# Patient Record
Sex: Male | Born: 1975 | Race: White | Hispanic: No | Marital: Single | Smoking: Current every day smoker
Health system: Southern US, Community
[De-identification: ages and names within clinical notes are randomized; demographics above are authoritative.]

## PROBLEM LIST (undated history)

## (undated) DIAGNOSIS — F1010 Alcohol abuse, uncomplicated: Secondary | ICD-10-CM

## (undated) DIAGNOSIS — Z720 Tobacco use: Secondary | ICD-10-CM

---

## 2000-07-06 ENCOUNTER — Emergency Department (HOSPITAL_COMMUNITY): Admission: EM | Admit: 2000-07-06 | Discharge: 2000-07-06 | Payer: Self-pay | Admitting: Emergency Medicine

## 2000-08-19 ENCOUNTER — Encounter: Payer: Self-pay | Admitting: Emergency Medicine

## 2000-08-19 ENCOUNTER — Emergency Department (HOSPITAL_COMMUNITY): Admission: EM | Admit: 2000-08-19 | Discharge: 2000-08-19 | Payer: Self-pay | Admitting: Emergency Medicine

## 2000-10-24 ENCOUNTER — Encounter: Payer: Self-pay | Admitting: Emergency Medicine

## 2000-10-24 ENCOUNTER — Emergency Department (HOSPITAL_COMMUNITY): Admission: EM | Admit: 2000-10-24 | Discharge: 2000-10-24 | Payer: Self-pay | Admitting: Emergency Medicine

## 2001-04-03 ENCOUNTER — Emergency Department (HOSPITAL_COMMUNITY): Admission: EM | Admit: 2001-04-03 | Discharge: 2001-04-03 | Payer: Self-pay | Admitting: Emergency Medicine

## 2001-04-04 ENCOUNTER — Encounter: Payer: Self-pay | Admitting: Emergency Medicine

## 2002-04-22 ENCOUNTER — Encounter: Payer: Self-pay | Admitting: Emergency Medicine

## 2002-04-22 ENCOUNTER — Emergency Department (HOSPITAL_COMMUNITY): Admission: EM | Admit: 2002-04-22 | Discharge: 2002-04-22 | Payer: Self-pay | Admitting: Emergency Medicine

## 2002-10-09 ENCOUNTER — Encounter: Payer: Self-pay | Admitting: Orthopedic Surgery

## 2002-10-09 ENCOUNTER — Observation Stay (HOSPITAL_COMMUNITY): Admission: EM | Admit: 2002-10-09 | Discharge: 2002-10-09 | Payer: Self-pay | Admitting: Emergency Medicine

## 2002-10-09 ENCOUNTER — Encounter: Payer: Self-pay | Admitting: *Deleted

## 2003-02-06 ENCOUNTER — Ambulatory Visit (HOSPITAL_BASED_OUTPATIENT_CLINIC_OR_DEPARTMENT_OTHER): Admission: RE | Admit: 2003-02-06 | Discharge: 2003-02-06 | Payer: Self-pay | Admitting: Orthopedic Surgery

## 2004-04-07 ENCOUNTER — Emergency Department (HOSPITAL_COMMUNITY): Admission: EM | Admit: 2004-04-07 | Discharge: 2004-04-07 | Payer: Self-pay | Admitting: Emergency Medicine

## 2004-05-23 ENCOUNTER — Emergency Department (HOSPITAL_COMMUNITY): Admission: EM | Admit: 2004-05-23 | Discharge: 2004-05-23 | Payer: Self-pay | Admitting: *Deleted

## 2004-05-25 ENCOUNTER — Emergency Department (HOSPITAL_COMMUNITY): Admission: EM | Admit: 2004-05-25 | Discharge: 2004-05-25 | Payer: Self-pay | Admitting: Emergency Medicine

## 2005-03-18 ENCOUNTER — Emergency Department (HOSPITAL_COMMUNITY): Admission: AC | Admit: 2005-03-18 | Discharge: 2005-03-19 | Payer: Self-pay

## 2005-06-06 ENCOUNTER — Emergency Department (HOSPITAL_COMMUNITY): Admission: EM | Admit: 2005-06-06 | Discharge: 2005-06-06 | Payer: Self-pay | Admitting: Emergency Medicine

## 2005-07-16 ENCOUNTER — Emergency Department (HOSPITAL_COMMUNITY): Admission: EM | Admit: 2005-07-16 | Discharge: 2005-07-16 | Payer: Self-pay | Admitting: Emergency Medicine

## 2006-04-24 ENCOUNTER — Emergency Department (HOSPITAL_COMMUNITY): Admission: EM | Admit: 2006-04-24 | Discharge: 2006-04-25 | Payer: Self-pay | Admitting: Emergency Medicine

## 2008-06-02 ENCOUNTER — Emergency Department (HOSPITAL_COMMUNITY): Admission: EM | Admit: 2008-06-02 | Discharge: 2008-06-02 | Payer: Self-pay | Admitting: Emergency Medicine

## 2009-04-11 ENCOUNTER — Emergency Department (HOSPITAL_COMMUNITY): Admission: EM | Admit: 2009-04-11 | Discharge: 2009-04-11 | Payer: Self-pay | Admitting: Emergency Medicine

## 2011-05-05 NOTE — Op Note (Signed)
   NAME:  Marcus Zamora, Marcus Zamora                            ACCOUNT NO.:  [id number]   MEDICAL RECORD NO.:  [id number]                   PATIENT TYPE:  AMB   LOCATION:  DSC                                  FACILITY:  [facility]   PHYSICIAN:  Thera Flake., M.D.             DATE OF BIRTH:  1975-12-25   DATE OF PROCEDURE:  DATE OF DISCHARGE:                                 OPERATIVE REPORT   PREOPERATIVE DIAGNOSIS:  Retained Syndesmotic screws of left ankle.   POSTOPERATIVE DIAGNOSIS:  Retained Syndesmotic screws of left ankle.   OPERATION:  Removal Syndesmotic screws (x 2) left ankle.   SURGEON:  Dyke Brackett, M.D.   ANESTHESIA:  General   INDICATIONS FOR PROCEDURE:  This is a 35 year old status post ORIF of ankle  with retained Syndesmotic screws for removal as an outpatient.   DESCRIPTION OF PROCEDURE:  Sterile prepped and draped.  Exsanguination of  leg.  Placement of tourniquet 350.  About a 2 inch incision was opened over  the two Syndesmotic screws which were identified under a moderate amount of  scar tissue.  They were removed without difficulty.  Their location was  confirmed with an OEC C-arm.  Copious irrigation carried out.  Closure with  2-0 Vicryl and skin clips.  Marcaine with epinephrine in the wound.  Light  compressive sterile dressing applied.  The patient taken to the recovery  room in stable condition.                                               Thera Flake., M.D.    WDC/MEDQ  D:  02/06/2003  T:  02/06/2003  Job:  [id number]

## 2011-05-05 NOTE — Consult Note (Signed)
[facility]  Patient:    Marcus Zamora                           MRN: [id number] Adm. Date:  [id number] Attending:  Cathren Laine CC:         Trudi Ida. Denton Lank, M.D.   Consultation Report  HISTORY OF PRESENT ILLNESS:  I had the pleasure to see Marcus Zamora in the emergency room at [facility] today.  He is a 35 year old right-hand dominant white male who sustained an injury to his right dominant hand 12 hours ago.  The patient placed his hand through a piece of plate glass.  This occurred around 8 p.m. last night.  He did not have a ride to the emergency room and thus presents today 12-14 hours after the incident for evaluation. He denies numbness and tingling but complains of excruciating pain.  The patient is unable to move the fingers fully due to the pain.  He denies other injury in the opposite extremity or bilateral lower extremities.  His tetanus shot is questionable, and thus I am giving him one today so it will be up-to-date.  ALLERGIES:  None.  MEDICATIONS:  None.  PAST SURGICAL HISTORY:  None.  PAST MEDICAL HISTORY:  None.  SOCIAL HISTORY:  He smokes two packs per day, occasionally drinks.  He is a Designer, fashion/clothing at [employer].  PHYSICAL EXAMINATION:  GENERAL:  This was a white male, alert and oriented, in no acute distress.  HEENT:  Normal.  EXTREMITIES:  The left upper extremity is atraumatic.  Right upper extremity has lacerations, one 3 cm over the dorsal wrist region at the radiocarpal joint.  There is another one just distal to this.  The patient has lacerations with a foreign body noted on x-ray about the mid-metacarpal region of the index finger and a minor abrasion about the index finger PIP region radially. The middle and ring finger have dorsal lacerations over the P1 regions.  The small finger does not have any lacerations over the top of it, but there are two dorsal lacerations over the hand in line with the ring  and small finger metacarpal.  There are no signs or symptoms of compartment syndrome. Extension is difficult to fully examine due to pain.  The patient has normal median and ulnar nerve sensation as well as radial nerve sensation.  The patient does not exhibit any proximal forearm pain.  DIAGNOSTIC EVALUATION:  On x-ray, the patient has no obvious fractures, dislocations, space-occupying lesion in the bone; however, there is a foreign body (glass) in the soft tissue about the index metacarpal.  IMPRESSION:  Status post glass laceration to multiple lesions about the hand.  PLAN:  I verbally consented the patient for I&D and repair of structures as needed.  DESCRIPTION OF PROCEDURE:  The patient was given a superficial radial nerve block as well as digital blocks as necessary and dorsal cutaneous nerve blocks by myself with 1% lidocaine without epinephrine.  He tolerated this well. Following this, I soaked him in peroxide and then prepped and draped him with Betadine scrub followed by Betadine paint, and once the sterile field was secured, the patient had I&D of the dorsal wrist laceration.  This was a 3 cm laceration.  There was another laceration just distal to this that was 1 cm. I&D of the skin, subcutaneous tissue, and extensor tendon region was accomplished.  I explored the [facility] and  EIP in this region, and they were intact.  There was a small scrape to them, but the majority was intact and did not need repair.  I thus I&Dd it, placed one loose chromic suture in this region, and allowed the wound to remain open to allow for egress of fluid. Following this, attention was turned toward the mid-metacarpal region of the index finger, where I performed I&D of the skin and subcutaneous tissue and tendinous tissue.  There was a small 5% laceration to the EIP tendon.  The EDC tendon was intact, interestingly.  The patient had three pieces of glass removed from the area.  I copiously  irrigated the wound and periosteum over the index metacarpal.  The index metacarpal was not fractured.  I also I&Dd an abrasion about the PIP region radially of the middle finger.  The patient underwent I&D of a 1 cm wound about the dorsal ulnar hand region and a 1 cm wound over the dorsal aspect of the middle finger.  This includes the skin and subcutaneous tissue.  I explored the extensor tendon about the dorsal hand wound dorso-ulnarly in line with the ring finger, and this was intact. Following this, I I&Dd skin and subcutaneous tissue and extensor tendon region of the ring finger to the right hand and performed a repair of a 50% laceration to the EDC overlying the P1 region.  This was done with Mersilene, and I loosely closed this area.  The other areas were left open, as this was a late presentation of the injury.  The patient tolerated this well without difficulty.  All areas were copiously I&Dd and tended to.  I do think he can be placed on an early range of motion program for the extensor tendon laceration, given its size.  Once this was done, areas were dressed with Xeroform gauze and Kerlix was placed around the hand, followed by Kling wrap. The patient had a sling applied.  He was given a tetanus shot and 1 g of Ancef IM.  He will return to see me in one weeks time and continue elevation, take Keflex as well as Percocet for pain.  All questions have been encouraged and answered. DD:  08/19/00 TD:  08/20/00 Job: [id number]

## 2011-05-05 NOTE — Op Note (Signed)
   NAME:  Marcus Zamora, Marcus Zamora NO.:  [id number]   MEDICAL RECORD NO.:  [id number]                   PATIENT TYPE:  OBV   LOCATION:  0480                                 FACILITY:  [facility]   PHYSICIAN:  Thera Flake., MD               DATE OF BIRTH:  Oct 14, 1976   DATE OF PROCEDURE:  10/09/2002  DATE OF DISCHARGE:  10/09/2002                                 OPERATIVE REPORT   PREOPERATIVE DIAGNOSIS:  Displaced lateral malleolus fracture with  disruption of syndesmosis and medial deltoid.   POSTOPERATIVE DIAGNOSIS:  Displaced lateral malleolus fracture with  disruption of syndesmosis and medial deltoid.   OPERATION:  1. Open reduction and internal fixation of fibula.  2. Open reduction and internal fixation of syndesmosis (a small fragment set     8-hole semi-tubular plate with two 4-0 syndesmotic screws).   SURGEON:  Dyke Brackett, MD   ASSISTANT:  Jamelle Rushing, P.A.   TOURNIQUET TIME:  Approximately one  hour.   DESCRIPTION OF PROCEDURE:  After sterile prep and drape the leg was  exsanguinated and the tourniquet was inflated to 350. A longitudinal  incision was made over the fibula. Identification of a short oblique fibula  fracture was revisionally reduced. Fixed with an 8-hole plate. One of the  screw holes could not be used due to the fact that it was over the fracture  site. Two holes were placed into the distal piece which was somewhat small  in terms of the smaller distal fragment with approximately 3 to 4 holes  proximally. Two of the intervening holes above the syndesmosis were used to  fix the syndesmosis back with 4-0 cancellous screws, reducing the  syndesmosis slightly anatomically in an anatomic reduction of the fibula  confirmed by the x-ray.   The wound was irrigated and closed with 0 Vicryl, 2-0 Vicryl and skin clips.  Marcaine without epinephrine was infiltrated into the wound. A lightly  compressive sterile dressing was  applied.                                               Thera Flake., MD    WDC/MEDQ  D:  10/09/2002  T:  10/10/2002  Job:  [id number]

## 2011-06-25 ENCOUNTER — Emergency Department (HOSPITAL_COMMUNITY): Payer: Self-pay

## 2011-06-25 ENCOUNTER — Emergency Department (HOSPITAL_COMMUNITY)
Admission: EM | Admit: 2011-06-25 | Discharge: 2011-06-25 | Disposition: A | Discharge status: Home / Self Care | Payer: Self-pay | Attending: Emergency Medicine | Admitting: Emergency Medicine

## 2011-06-25 DIAGNOSIS — R071 Chest pain on breathing: Secondary | ICD-10-CM | POA: Insufficient documentation

## 2013-05-22 ENCOUNTER — Encounter (HOSPITAL_COMMUNITY): Payer: Self-pay | Admitting: *Deleted

## 2013-05-22 ENCOUNTER — Emergency Department (HOSPITAL_COMMUNITY)
Admission: EM | Admit: 2013-05-22 | Discharge: 2013-05-23 | Disposition: A | Discharge status: Home / Self Care | Payer: Self-pay | Attending: Emergency Medicine | Admitting: Emergency Medicine

## 2013-05-22 ENCOUNTER — Emergency Department (HOSPITAL_COMMUNITY): Payer: Self-pay

## 2013-05-22 DIAGNOSIS — F17200 Nicotine dependence, unspecified, uncomplicated: Secondary | ICD-10-CM | POA: Insufficient documentation

## 2013-05-22 DIAGNOSIS — T7411XA Adult physical abuse, confirmed, initial encounter: Secondary | ICD-10-CM | POA: Insufficient documentation

## 2013-05-22 DIAGNOSIS — S2239XA Fracture of one rib, unspecified side, initial encounter for closed fracture: Secondary | ICD-10-CM | POA: Insufficient documentation

## 2013-05-22 DIAGNOSIS — S2231XA Fracture of one rib, right side, initial encounter for closed fracture: Secondary | ICD-10-CM

## 2013-05-22 NOTE — ED Notes (Signed)
Wait time advised 
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
## 2013-05-23 MED ORDER — OXYCODONE-ACETAMINOPHEN 5-325 MG PO TABS
2.0000 | ORAL_TABLET | Freq: Once | ORAL | Status: AC
  Administered 2013-05-23: 2 via ORAL
  Filled 2013-05-23: qty 2

## 2013-05-23 MED ORDER — OXYCODONE-ACETAMINOPHEN 5-325 MG PO TABS: 2.0000 | ORAL_TABLET | ORAL | Status: DC | PRN
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
## 2015-06-22 ENCOUNTER — Emergency Department (HOSPITAL_COMMUNITY)
Admission: EM | Admit: 2015-06-22 | Discharge: 2015-06-22 | Disposition: A | Discharge status: Home / Self Care | Payer: Self-pay | Attending: Emergency Medicine | Admitting: Emergency Medicine

## 2015-06-22 ENCOUNTER — Emergency Department (HOSPITAL_COMMUNITY): Payer: Self-pay

## 2015-06-22 ENCOUNTER — Encounter (HOSPITAL_COMMUNITY): Payer: Self-pay | Admitting: Emergency Medicine

## 2015-06-22 DIAGNOSIS — J159 Unspecified bacterial pneumonia: Secondary | ICD-10-CM | POA: Insufficient documentation

## 2015-06-22 DIAGNOSIS — J189 Pneumonia, unspecified organism: Secondary | ICD-10-CM

## 2015-06-22 DIAGNOSIS — R079 Chest pain, unspecified: Secondary | ICD-10-CM | POA: Insufficient documentation

## 2015-06-22 DIAGNOSIS — Z720 Tobacco use: Secondary | ICD-10-CM | POA: Insufficient documentation

## 2015-06-22 LAB — BRAIN NATRIURETIC PEPTIDE: B NATRIURETIC PEPTIDE 5: 34.1 pg/mL (ref 0.0–100.0)

## 2015-06-22 LAB — CBC
HCT: 35.5 % — ABNORMAL LOW (ref 39.0–52.0)
HEMOGLOBIN: 12.2 g/dL — AB (ref 13.0–17.0)
MCH: 32.2 pg (ref 26.0–34.0)
MCHC: 34.4 g/dL (ref 30.0–36.0)
MCV: 93.7 fL (ref 78.0–100.0)
Platelets: 534 10*3/uL — ABNORMAL HIGH (ref 150–400)
RBC: 3.79 MIL/uL — AB (ref 4.22–5.81)
RDW: 12.2 % (ref 11.5–15.5)
WBC: 17.5 10*3/uL — ABNORMAL HIGH (ref 4.0–10.5)

## 2015-06-22 LAB — BASIC METABOLIC PANEL
ANION GAP: 12 (ref 5–15)
BUN: 5 mg/dL — ABNORMAL LOW (ref 6–20)
CHLORIDE: 101 mmol/L (ref 101–111)
CO2: 21 mmol/L — AB (ref 22–32)
CREATININE: 0.82 mg/dL (ref 0.61–1.24)
Calcium: 8.6 mg/dL — ABNORMAL LOW (ref 8.9–10.3)
GFR calc Af Amer: >60 mL/min (ref >60)
GFR calc non Af Amer: >60 mL/min (ref >60)
GLUCOSE: 77 mg/dL (ref 65–99)
Potassium: 3.8 mmol/L (ref 3.5–5.1)
Sodium: 134 mmol/L — ABNORMAL LOW (ref 135–145)

## 2015-06-22 LAB — I-STAT TROPONIN, ED: Troponin i, poc: 0.01 ng/mL (ref 0.00–0.08)

## 2015-06-22 MED ORDER — DOXYCYCLINE HYCLATE 100 MG PO CAPS: 100.0000 mg | ORAL_CAPSULE | Freq: Two times a day (BID) | ORAL | Status: DC

## 2015-06-22 MED ORDER — BENZONATATE 100 MG PO CAPS: 100.0000 mg | ORAL_CAPSULE | Freq: Three times a day (TID) | ORAL | Status: DC | PRN

## 2015-06-22 MED ORDER — HYDROCODONE-ACETAMINOPHEN 5-325 MG PO TABS: 1.0000 | ORAL_TABLET | Freq: Four times a day (QID) | ORAL | Status: DC | PRN

## 2015-06-22 MED ORDER — ALBUTEROL SULFATE (2.5 MG/3ML) 0.083% IN NEBU
5.0000 mg | INHALATION_SOLUTION | Freq: Once | RESPIRATORY_TRACT | Status: AC
  Administered 2015-06-22: 5 mg via RESPIRATORY_TRACT
  Filled 2015-06-22: qty 6

## 2015-06-22 MED ORDER — HYDROCODONE-ACETAMINOPHEN 5-325 MG PO TABS
1.0000 | ORAL_TABLET | Freq: Once | ORAL | Status: AC
  Administered 2015-06-22: 1 via ORAL
  Filled 2015-06-22: qty 1

## 2015-06-22 MED ORDER — AEROCHAMBER PLUS FLO-VU MEDIUM MISC
1.0000 | Freq: Once | Status: AC
  Administered 2015-06-22: 1
  Filled 2015-06-22: qty 1

## 2015-06-22 MED ORDER — DOXYCYCLINE HYCLATE 100 MG PO TABS
100.0000 mg | ORAL_TABLET | Freq: Once | ORAL | Status: AC
  Administered 2015-06-22: 100 mg via ORAL
  Filled 2015-06-22: qty 1

## 2015-06-22 MED ORDER — HYDROCODONE-HOMATROPINE 5-1.5 MG/5ML PO SYRP
5.0000 mL | ORAL_SOLUTION | Freq: Once | ORAL | Status: AC
Start: 2015-06-22 — End: 2015-06-22
  Administered 2015-06-22: 5 mL via ORAL
  Filled 2015-06-22: qty 5

## 2015-06-22 MED ORDER — AEROCHAMBER PLUS W/MASK MISC
Status: AC
  Filled 2015-06-22: qty 1

## 2015-06-22 MED ORDER — HYDROCODONE-ACETAMINOPHEN 5-325 MG PO TABS: 2.0000 | ORAL_TABLET | Freq: Once | ORAL | Status: DC

## 2015-06-22 MED ORDER — ALBUTEROL SULFATE HFA 108 (90 BASE) MCG/ACT IN AERS
2.0000 | INHALATION_SPRAY | RESPIRATORY_TRACT | Status: DC | PRN
  Administered 2015-06-22: 2 via RESPIRATORY_TRACT
  Filled 2015-06-22: qty 6.7

## 2015-06-22 NOTE — ED Notes (Signed)
Pt O2 100%, pt pulse 102 while ambulating. Pt ambulated successfully

## 2015-06-22 NOTE — ED Notes (Signed)
Pt from home for eval of generalized chest and "rib pain", pt also reports productive cough with brown mucus production. Pt denies any fever, n/v/d. Pt is smoker and states hx of pneumonia. Pt able to speak in complete sentences.

## 2015-06-22 NOTE — ED Notes (Signed)
Pt A&Ox4, ambulatory at d/c with steady gait, NAD 

## 2015-06-22 NOTE — Discharge Instructions (Signed)
1. Medications: tessalon, doxycycline, vicodin for severe pain, usual home medications 2. Treatment: rest, drink plenty of fluids,  3. Follow Up: Please followup with [facility] in 1 week for follow-up; Please return to the ER for high fevers, worsening shortness of breath or other concerning symptoms  Pneumonia Pneumonia is an infection of the lungs.  CAUSES Pneumonia may be caused by bacteria or a virus. Usually, these infections are caused by breathing infectious particles into the lungs (respiratory tract). SIGNS AND SYMPTOMS   Cough.  Fever.  Chest pain.  Increased rate of breathing.  Wheezing.  Mucus production. DIAGNOSIS  If you have the common symptoms of pneumonia, your health care provider will typically confirm the diagnosis with a chest X-ray. The X-ray will show an abnormality in the lung (pulmonary infiltrate) if you have pneumonia. Other tests of your blood, urine, or sputum may be done to find the specific cause of your pneumonia. Your health care provider may also do tests (blood gases or pulse oximetry) to see how well your lungs are working. TREATMENT  Some forms of pneumonia may be spread to other people when you cough or sneeze. You may be asked to wear a mask before and during your exam. Pneumonia that is caused by bacteria is treated with antibiotic medicine. Pneumonia that is caused by the influenza virus may be treated with an antiviral medicine. Most other viral infections must run their course. These infections will not respond to antibiotics.  HOME CARE INSTRUCTIONS   Cough suppressants may be used if you are losing too much rest. However, coughing protects you by clearing your lungs. You should avoid using cough suppressants if you can.  Your health care provider may have prescribed medicine if he or she thinks your pneumonia is caused by bacteria or influenza. Finish your medicine even if you start to feel better.  Your health care provider may  also prescribe an expectorant. This loosens the mucus to be coughed up.  Take medicines only as directed by your health care provider.  Do not smoke. Smoking is a common cause of bronchitis and can contribute to pneumonia. If you are a smoker and continue to smoke, your cough may last several weeks after your pneumonia has cleared.  A cold steam vaporizer or humidifier in your room or home may help loosen mucus.  Coughing is often worse at night. Sleeping in a semi-upright position in a recliner or using a couple pillows under your head will help with this.  Get rest as you feel it is needed. Your body will usually let you know when you need to rest. PREVENTION A pneumococcal shot (vaccine) is available to prevent a common bacterial cause of pneumonia. This is usually suggested for:  People over 91 years old.  Patients on chemotherapy.  People with chronic lung problems, such as bronchitis or emphysema.  People with immune system problems. If you are over 65 or have a high risk condition, you may receive the pneumococcal vaccine if you have not received it before. In some countries, a routine influenza vaccine is also recommended. This vaccine can help prevent some cases of pneumonia.You may be offered the influenza vaccine as part of your care. If you smoke, it is time to quit. You may receive instructions on how to stop smoking. Your health care provider can provide medicines and counseling to help you quit. SEEK MEDICAL CARE IF: You have a fever. SEEK IMMEDIATE MEDICAL CARE IF:   Your illness becomes worse.  This is especially true if you are elderly or weakened from any other disease.  You cannot control your cough with suppressants and are losing sleep.  You begin coughing up blood.  You develop pain which is getting worse or is uncontrolled with medicines.  Any of the symptoms which initially brought you in for treatment are getting worse rather than better.  You develop  shortness of breath or chest pain. MAKE SURE YOU:   Understand these instructions.  Will watch your condition.  Will get help right away if you are not doing well or get worse. Document Released: 12/04/2005 Document Revised: 04/20/2014 Document Reviewed: 02/23/2011 [facility] Patient Information 2015 [employer] [place]. This information is not intended to replace advice given to you by your health care provider. Make sure you discuss any questions you have with your health care provider.

## 2015-06-22 NOTE — ED Provider Notes (Signed)
CSN: [id number]     Arrival date & time 06/22/15  1511 History   First MD Initiated Contact with Patient 06/22/15 1645     Chief Complaint  Patient presents with  . Shortness of Breath  . Chest Pain     (Consider location/radiation/quality/duration/timing/severity/associated sxs/prior Treatment) The history is provided by the patient and medical records. No language interpreter was used.     JAMEON DELLER is a 39 y.o. male  with a hx of cigarette smoking (2ppd for 25 years) presents to the Emergency Department complaining of gradual, persistent, progressively worsening bilateral chest and rib pain with associated SOB onset 12 days ago.  Pt reports symptoms are worse at night.  Pt reports he has felt hot and is having night sweats and cold chills, but has not checked his temperature.  Pt denies recent hospitalization, group home or shelter exposure.  Patient denies associated symptoms. Nothing makes the symptoms better or worse. He denies headache, neck pain, dental pain, nausea, vomiting, diarrhea, weakness, dizziness, syncope.   History reviewed. No pertinent past medical history. History reviewed. No pertinent past surgical history. No family history on file. History  Substance Use Topics  . Smoking status: Current Every Day Smoker -- 2.00 packs/day  . Smokeless tobacco: Not on file  . Alcohol Use: Yes    Review of Systems  Constitutional: Negative for fever, diaphoresis, appetite change, fatigue and unexpected weight change.  HENT: Negative for mouth sores.   Eyes: Negative for visual disturbance.  Respiratory: Positive for cough, chest tightness, shortness of breath and wheezing.   Cardiovascular: Positive for chest pain (with cough and deep inspiration).  Gastrointestinal: Negative for nausea, vomiting, abdominal pain, diarrhea and constipation.  Endocrine: Negative for polydipsia, polyphagia and polyuria.  Genitourinary: Negative for dysuria, urgency, frequency and hematuria.   Musculoskeletal: Negative for back pain and neck stiffness.  Skin: Negative for rash.  Allergic/Immunologic: Negative for immunocompromised state.  Neurological: Negative for syncope, light-headedness and headaches.  Hematological: Does not bruise/bleed easily.  Psychiatric/Behavioral: Negative for sleep disturbance. The patient is not nervous/anxious.       Allergies  Review of patient's allergies indicates no known allergies.  Home Medications   Prior to Admission medications   Medication Sig Start Date End Date Taking? Authorizing Provider  benzonatate (TESSALON) 100 MG capsule Take 1 capsule (100 mg total) by mouth 3 (three) times daily as needed for cough. 06/22/15   Darolyn Double, PA-C  doxycycline (VIBRAMYCIN) 100 MG capsule Take 1 capsule (100 mg total) by mouth 2 (two) times daily. 06/22/15   Raffi Milstein, PA-C  HYDROcodone-acetaminophen (NORCO/VICODIN) 5-325 MG per tablet Take 1-2 tablets by mouth every 6 (six) hours as needed for moderate pain or severe pain. 06/22/15   Mekiah Cambridge, PA-C  oxyCODONE-acetaminophen (PERCOCET) 5-325 MG per tablet Take 2 tablets by mouth every 4 (four) hours as needed for pain. Patient not taking: Reported on 06/22/2015 05/23/13   Geoffery Lyons, MD   BP 129/73 mmHg  Pulse 99  Temp(Src) 98 F (36.7 C) (Oral)  Resp 25  Ht  (1.854 m)  Wt 160 lb (72.576 kg)  BMI 21.11 kg/m2  SpO2 93% Physical Exam  Constitutional: He is oriented to person, place, and time. He appears well-developed and well-nourished. No distress.  Awake, alert, nontoxic appearance  HENT:  Head: Normocephalic and atraumatic.  Right Ear: Tympanic membrane, external ear and ear canal normal.  Left Ear: Tympanic membrane, external ear and ear canal normal.  Nose: No mucosal edema  or rhinorrhea. No epistaxis. Right sinus exhibits no maxillary sinus tenderness and no frontal sinus tenderness. Left sinus exhibits no maxillary sinus tenderness and no frontal sinus  tenderness.  Mouth/Throat: Uvula is midline, oropharynx is clear and moist and mucous membranes are normal. Mucous membranes are not pale and not cyanotic. No oropharyngeal exudate, posterior oropharyngeal edema, posterior oropharyngeal erythema or tonsillar abscesses.  Eyes: Conjunctivae are normal. Pupils are equal, round, and reactive to light. No scleral icterus.  Neck: Normal range of motion and full passive range of motion without pain. Neck supple.  Cardiovascular: Normal rate, regular rhythm, normal heart sounds and intact distal pulses.   No murmur heard. No tachycardia  Pulmonary/Chest: Effort normal. No accessory muscle usage or stridor. No tachypnea. No respiratory distress. He has decreased breath sounds. He has wheezes. He has rhonchi in the left middle field.  Equal chest expansion Decreased breath sounds with fine expiratory wheeze throughout Progress heard in the left middle lobe alone   Abdominal: Soft. Bowel sounds are normal. He exhibits no mass. There is no tenderness. There is no rebound and no guarding.  Musculoskeletal: Normal range of motion. He exhibits no edema.  Lymphadenopathy:    He has no cervical adenopathy.  Neurological: He is alert and oriented to person, place, and time.  Speech is clear and goal oriented Moves extremities without ataxia  Skin: Skin is warm and dry. No rash noted. He is not diaphoretic.  Psychiatric: He has a normal mood and affect.  Nursing note and vitals reviewed.   ED Course  Procedures (including critical care time) Labs Review Labs Reviewed  CBC - Abnormal; Notable for the following:    WBC 17.5 (*)    RBC 3.79 (*)    Hemoglobin 12.2 (*)    HCT 35.5 (*)    Platelets 534 (*)    All other components within normal limits  BASIC METABOLIC PANEL - Abnormal; Notable for the following:    Sodium 134 (*)    CO2 21 (*)    BUN 5 (*)    Calcium 8.6 (*)    All other components within normal limits  BRAIN NATRIURETIC PEPTIDE   I-STAT TROPOININ, ED    Imaging Review Dg Chest 2 View  06/22/2015   CLINICAL DATA:  Cough, congestion for 1 week, smoker  EXAM: CHEST  2 VIEW  COMPARISON:  05/22/2013  FINDINGS: Cardiomediastinal silhouette is stable. There is hazy streaky infiltrate in left midlung/left upper lobe highly suspicious for pneumonia. Follow-up to resolution after appropriate treatment is recommended. Hyperinflation is noted.  IMPRESSION: Streaky infiltrate in left midlung/left upper lobe highly suspicious for pneumonia. Follow-up to resolution after appropriate treatment is recommended.   Electronically Signed   By: Natasha Mead M.D.   On: 06/22/2015 16:14     EKG Interpretation None      MDM   Final diagnoses:  CAP (community acquired pneumonia)   Darryl Lent presents with chest pain with coughing and deep inspiration, shortness of breath and subjective fevers for the last 12 days. Here he has a leukocytosis of 17.5 and evidence of a large pneumonia on his chest x-ray.  Patient is well-appearing and without accessory muscle usage or tachypnea while sitting in bed. Will give albuterol, pain control and antiemetics here in the emergency department.  Afterwards he will be ambulated and if he does well he may be discharged home.  7:02 PM She ambulates here in the emergency department with normal oxygen saturation and without tachypnea or  significant shortness of breath.  PORT score 41.  Patient is low risk and may be discharged home. He has had no hypoxia here. His breath sounds have improved and his wheezing has resolved after albuterol. Patient will be discharged home with doxycycline, Tessalon, biking and for severe pain and albuterol. He will follow-up with the [facility] this week.   BP 129/73 mmHg  Pulse 99  Temp(Src) 98 F (36.7 C) (Oral)  Resp 25  Ht 6\' 1"  (1.854 m)  Wt 160 lb (72.576 kg)  BMI 21.11 kg/m2  SpO2 93%   Dierdre ForthHannah Valor Quaintance, PA-C 06/22/15 1906  Mancel BaleElliott Wentz,  MD 06/22/15 2352

## 2016-09-26 ENCOUNTER — Inpatient Hospital Stay (HOSPITAL_COMMUNITY)
Admission: EM | Admit: 2016-09-26 | Discharge: 2016-09-28 | DRG: 918 | Disposition: A | Discharge status: Home / Self Care | Payer: Self-pay | Attending: Internal Medicine | Admitting: Internal Medicine

## 2016-09-26 ENCOUNTER — Encounter (HOSPITAL_COMMUNITY): Payer: Self-pay

## 2016-09-26 DIAGNOSIS — T63001A Toxic effect of unspecified snake venom, accidental (unintentional), initial encounter: Secondary | ICD-10-CM | POA: Diagnosis present

## 2016-09-26 DIAGNOSIS — F17210 Nicotine dependence, cigarettes, uncomplicated: Secondary | ICD-10-CM | POA: Diagnosis present

## 2016-09-26 DIAGNOSIS — F1010 Alcohol abuse, uncomplicated: Secondary | ICD-10-CM | POA: Diagnosis present

## 2016-09-26 DIAGNOSIS — Z720 Tobacco use: Secondary | ICD-10-CM | POA: Diagnosis present

## 2016-09-26 DIAGNOSIS — T63061A Toxic effect of venom of other North and South American snake, accidental (unintentional), initial encounter: Principal | ICD-10-CM | POA: Diagnosis present

## 2016-09-26 DIAGNOSIS — D72829 Elevated white blood cell count, unspecified: Secondary | ICD-10-CM | POA: Diagnosis present

## 2016-09-26 DIAGNOSIS — Z23 Encounter for immunization: Secondary | ICD-10-CM

## 2016-09-26 HISTORY — DX: Alcohol abuse, uncomplicated: F10.10

## 2016-09-26 HISTORY — DX: Tobacco use: Z72.0

## 2016-09-26 LAB — CBC WITH DIFFERENTIAL/PLATELET
BASOS ABS: 0.1 10*3/uL (ref 0.0–0.1)
BASOS PCT: 0 %
EOS ABS: 0.1 10*3/uL (ref 0.0–0.7)
EOS PCT: 1 %
HCT: 41.4 % (ref 39.0–52.0)
Hemoglobin: 14.1 g/dL (ref 13.0–17.0)
Lymphocytes Relative: 17 %
Lymphs Abs: 2.4 10*3/uL (ref 0.7–4.0)
MCH: 32.9 pg (ref 26.0–34.0)
MCHC: 34.1 g/dL (ref 30.0–36.0)
MCV: 96.5 fL (ref 78.0–100.0)
MONO ABS: 0.8 10*3/uL (ref 0.1–1.0)
Monocytes Relative: 6 %
Neutro Abs: 10.9 10*3/uL — ABNORMAL HIGH (ref 1.7–7.7)
Neutrophils Relative %: 76 %
PLATELETS: 219 10*3/uL (ref 150–400)
RBC: 4.29 MIL/uL (ref 4.22–5.81)
RDW: 12.9 % (ref 11.5–15.5)
WBC: 14.4 10*3/uL — ABNORMAL HIGH (ref 4.0–10.5)

## 2016-09-26 LAB — COMPREHENSIVE METABOLIC PANEL
ALT: 15 U/L — ABNORMAL LOW (ref 17–63)
AST: 31 U/L (ref 15–41)
Albumin: 3.5 g/dL (ref 3.5–5.0)
Alkaline Phosphatase: 55 U/L (ref 38–126)
Anion gap: 9 (ref 5–15)
BUN: 8 mg/dL (ref 6–20)
CO2: 19 mmol/L — ABNORMAL LOW (ref 22–32)
Calcium: 8.2 mg/dL — ABNORMAL LOW (ref 8.9–10.3)
Chloride: 110 mmol/L (ref 101–111)
Creatinine, Ser: 0.76 mg/dL (ref 0.61–1.24)
GFR calc Af Amer: >60 mL/min (ref >60)
GFR calc non Af Amer: >60 mL/min (ref >60)
Glucose, Bld: 107 mg/dL — ABNORMAL HIGH (ref 65–99)
Potassium: 3.5 mmol/L (ref 3.5–5.1)
Sodium: 138 mmol/L (ref 135–145)
Total Bilirubin: 0.5 mg/dL (ref 0.3–1.2)
Total Protein: 6.1 g/dL — ABNORMAL LOW (ref 6.5–8.1)

## 2016-09-26 LAB — APTT: APTT: 38 s — AB (ref 24–36)

## 2016-09-26 LAB — PROTIME-INR
INR: 1.03
Prothrombin Time: 13.5 seconds (ref 11.4–15.2)

## 2016-09-26 LAB — FIBRINOGEN: Fibrinogen: 376 mg/dL (ref 210–475)

## 2016-09-26 MED ORDER — TETANUS-DIPHTH-ACELL PERTUSSIS 5-2.5-18.5 LF-MCG/0.5 IM SUSP
0.5000 mL | Freq: Once | INTRAMUSCULAR | Status: AC
  Administered 2016-09-26: 0.5 mL via INTRAMUSCULAR
  Filled 2016-09-26: qty 0.5

## 2016-09-26 MED ORDER — ONDANSETRON HCL 4 MG/2ML IJ SOLN
4.0000 mg | Freq: Once | INTRAMUSCULAR | Status: AC
  Administered 2016-09-26: 4 mg via INTRAVENOUS
  Filled 2016-09-26: qty 2

## 2016-09-26 MED ORDER — HYDROMORPHONE HCL 1 MG/ML IJ SOLN
1.0000 mg | INTRAMUSCULAR | Status: AC
  Administered 2016-09-26 (×3): 1 mg via INTRAVENOUS
  Filled 2016-09-26 (×3): qty 1

## 2016-09-26 MED ORDER — SODIUM CHLORIDE 0.9 % IV BOLUS (SEPSIS)
500.0000 mL | Freq: Once | INTRAVENOUS | Status: AC
  Administered 2016-09-26: 500 mL via INTRAVENOUS

## 2016-09-26 MED ORDER — SODIUM CHLORIDE 0.9 % IV SOLN
Freq: Once | INTRAVENOUS | Status: AC
  Administered 2016-09-26: 21:00:00 via INTRAVENOUS

## 2016-09-26 MED ORDER — CROTALIDAE POLYVAL IMMUNE FAB IV SOLR
4.0000 | Freq: Once | INTRAVENOUS | Status: AC
  Administered 2016-09-26: 72 mL via INTRAVENOUS
  Filled 2016-09-26: qty 72

## 2016-09-26 NOTE — ED Provider Notes (Signed)
[facility]-EMERGENCY DEPT Provider Note   CSN: [id number] Arrival date & time: 09/26/16  2057     History   Chief Complaint Chief Complaint  Patient presents with  . Snake Bite    HPI Marcus Zamora is a 40 y.o. male.  He presents for evaluation of snake bite wounds, to the left index finger. He states that he was bitten 2 times. He presents for evaluation of pain and swelling in the left hand and arm. No prior similar problem. He denies chest pain, shortness of breath, weakness, dizziness, headache, nausea, vomiting. There are no other no modifying factors.  HPI  History reviewed. No pertinent past medical history.  There are no active problems to display for this patient.   History reviewed. No pertinent surgical history.     Home Medications    Prior to Admission medications   Medication Sig Start Date End Date Taking? Authorizing Provider  benzonatate (TESSALON) 100 MG capsule Take 1 capsule (100 mg total) by mouth 3 (three) times daily as needed for cough. Patient not taking: Reported on 09/26/2016 06/22/15   Dahlia Client Muthersbaugh, PA-C  doxycycline (VIBRAMYCIN) 100 MG capsule Take 1 capsule (100 mg total) by mouth 2 (two) times daily. Patient not taking: Reported on 09/26/2016 06/22/15   Dahlia Client Muthersbaugh, PA-C  HYDROcodone-acetaminophen (NORCO/VICODIN) 5-325 MG per tablet Take 1-2 tablets by mouth every 6 (six) hours as needed for moderate pain or severe pain. Patient not taking: Reported on 09/26/2016 06/22/15   Dahlia Client Muthersbaugh, PA-C  oxyCODONE-acetaminophen (PERCOCET) 5-325 MG per tablet Take 2 tablets by mouth every 4 (four) hours as needed for pain. Patient not taking: Reported on 09/26/2016 05/23/13   Geoffery Lyons, MD    Family History History reviewed. No pertinent family history.  Social History Social History  Substance Use Topics  . Smoking status: Current Every Day Smoker    Packs/day: 2.00  . Smokeless tobacco: Never Used  . Alcohol use Yes      Allergies   Review of patient's allergies indicates no known allergies.   Review of Systems Review of Systems  All other systems reviewed and are negative.    Physical Exam Updated Vital Signs BP 139/90   Pulse 85   SpO2 100%   Physical Exam  Constitutional: He is oriented to person, place, and time. He appears well-developed and well-nourished.  HENT:  Head: Normocephalic and atraumatic.  Right Ear: External ear normal.  Left Ear: External ear normal.  Eyes: Conjunctivae and EOM are normal. Pupils are equal, round, and reactive to light.  Neck: Normal range of motion and phonation normal. Neck supple.  Cardiovascular: Normal rate, regular rhythm and normal heart sounds.   Pulmonary/Chest: Effort normal and breath sounds normal. He exhibits no bony tenderness.  Musculoskeletal:  Several puncture wounds to the dorsum of the left index finger. Moderate swelling, left index finger and hand. Swelling extends across the wrist to the mid forearm. On arrival, patient was in a splint, applied, by EMS. He appeared to have swelling to the elbow but patient stated he had only pain and swelling to the midforearm. Swelling was definitely above a line drawn by EMS, at the dorsal left wrist.  Neurological: He is alert and oriented to person, place, and time. No cranial nerve deficit or sensory deficit. He exhibits normal muscle tone. Coordination normal.  Skin: Skin is warm, dry and intact.  Psychiatric: He has a normal mood and affect. His behavior is normal. Judgment and thought content normal.  Nursing  note and vitals reviewed.    ED Treatments / Results  Labs (all labs ordered are listed, but only abnormal results are displayed) Labs Reviewed  COMPREHENSIVE METABOLIC PANEL  CBC WITH DIFFERENTIAL/PLATELET  PROTIME-INR  APTT  FIBRINOGEN    EKG  EKG Interpretation None       Radiology No results found.  Procedures Procedures (including critical care  time)  Medications Ordered in ED Medications  HYDROmorphone (DILAUDID) injection 1 mg (1 mg Intravenous Given 09/26/16 2204)  crotalidae polyvalent immune fab (CROFAB) 4 vial in sodium chloride 0.9 % 322 mL infusion (not administered)  sodium chloride 0.9 % bolus 500 mL (500 mLs Intravenous New Bag/Given 09/26/16 2119)  0.9 %  sodium chloride infusion ( Intravenous New Bag/Given 09/26/16 2120)  ondansetron (ZOFRAN) injection 4 mg (4 mg Intravenous Given 09/26/16 2119)  Tdap (BOOSTRIX) injection 0.5 mL (0.5 mLs Intramuscular Given 09/26/16 2158)     Initial Impression / Assessment and Plan / ED Course  I have reviewed the triage vital signs and the nursing notes.  Pertinent labs & imaging results that were available during my care of the patient were reviewed by me and considered in my medical decision making (see chart for details).  Clinical Course  Comment By Time  Initial score prior to labs for consideration of CroFab equal, 3, +1 for swelling, +1 tachycardia, +1 for xxx  Mancel BaleElliott Yailene Badia, MD 10/10 2142  Patient now has swelling, to the mid humeral region, which is greater than 20 inches, from the bite, which now gives a score of 4, requiring CroFab treatment Mancel BaleElliott Almena Hokenson, MD 10/10 2212    Medications  HYDROmorphone (DILAUDID) injection 1 mg (1 mg Intravenous Given 09/26/16 2204)  crotalidae polyvalent immune fab (CROFAB) 4 vial in sodium chloride 0.9 % 322 mL infusion (not administered)  sodium chloride 0.9 % bolus 500 mL (500 mLs Intravenous New Bag/Given 09/26/16 2119)  0.9 %  sodium chloride infusion ( Intravenous New Bag/Given 09/26/16 2120)  ondansetron (ZOFRAN) injection 4 mg (4 mg Intravenous Given 09/26/16 2119)  Tdap (BOOSTRIX) injection 0.5 mL (0.5 mLs Intramuscular Given 09/26/16 2158)    Patient Vitals for the past 24 hrs:  BP Pulse SpO2  09/26/16 2200 139/90 85 100 %  09/26/16 2145 130/75 83 98 %  09/26/16 2130 129/85 73 98 %  09/26/16 2115 (!) 134/102 83 97 %   09/26/16 2101 122/89 81 98 %    10:16 PM Reevaluation with update and discussion. After initial assessment and treatment, an updated evaluation reveals He continues to have pain and has now received Dilaudid 3 mg. CroFab has been ordered. Sita Mangen L    CRITICAL CARE Performed by: Mancel BaleWENTZ,Lucynda Rosano L Total critical care time: 35 minutes Critical care time was exclusive of separately billable procedures and treating other patients. Critical care was necessary to treat or prevent imminent or life-threatening deterioration. Critical care was time spent personally by me on the following activities: development of treatment plan with patient and/or surrogate as well as nursing, discussions with consultants, evaluation of patient's response to treatment, examination of patient, obtaining history from patient or surrogate, ordering and performing treatments and interventions, ordering and review of laboratory studies, ordering and review of radiographic studies, pulse oximetry and re-evaluation of patient's condition.   Final Clinical Impressions(s) / ED Diagnoses   Final diagnoses:  Toxic effect of snake venom, unintentional, initial encounter   Snake bite to finger with envenomation, and reaction extension to upper arm, he requires antibiotic treatment and stabilization in the  hospital setting.  Nursing Notes Reviewed/ Care Coordinated Applicable Imaging Reviewed Interpretation of Laboratory Data incorporated into ED treatment  Plan: Admit   New Prescriptions New Prescriptions   No medications on file     Mancel Bale, MD 09/30/16 1916

## 2016-09-26 NOTE — ED Triage Notes (Signed)
Pt arrived via EMS from c/o copperhead bite to right pointer finger.  Multiple bleeding puncture wounds noted.  Happened around 8pm when pt chased the snake and went to pick it up.  EMS gave fentanyl with no relief

## 2016-09-26 NOTE — H&P (Signed)
History and Physical    TIMO HARTWIG OZH:[id number] DOB: [birth year] DOA: 09/26/2016  Referring MD/NP/PA: Dr. Effie Shy PCP: No PCP Per Patient  Patient coming from: Home  Chief Complaint: Snake bite  HPI: MAZIN EMMA is a 40 y.o. male with medical history significant of alcohol and tobacco abuse; who presents after getting bit by a copperhead snake. Patient notes that he was sitting on  front porch of his house when one of his boys notice a snake in the ER. He normally catches black snakes and so he went after a snake and chased him down before he was able to get into the basement of her home. However, once he wanted to snake he was bitten twice on his left index finger. He merely had acute onset of pain and swelling in the left hand. Pain is rated as a 10 out of 10 on the pain scale. Never reports being previously bitten by a snake before. Swelling progressively worsened up to his shoulder. Denies any chest pain, shortness breath, weakness, dizziness, headache, nausea, vomiting, or diarrhea at this time. Patient notes that he smokes almost a pack cigarettes per day on average and drinks 12 pack beer on a daily basis.  ED Course: Upon admission into the emergency department patient was examined and seen to have vitals within normal. Lab results revealed WBC 14.4, PTT 38, and other vitals relatively within normal limits including fibrinogen level. Patient was given tetanus shot, 4 vails of crotalidae, and Dilaudid for pain while in the ED. Patient still reports pain being a 10 out of 10 at this time.  Review of Systems: As per HPI otherwise 10 point review of systems negative.   Past Medical History:  Diagnosis Date  . Alcohol abuse   . Tobacco abuse     History reviewed. No pertinent surgical history.   reports that he has been smoking.  He has been smoking about 2.00 packs per day. He has never used smokeless tobacco. He reports that he drinks alcohol. He reports that he uses drugs, including  Marijuana.  No Known Allergies  History reviewed. No pertinent family history.  Prior to Admission medications   Medication Sig Start Date End Date Taking? Authorizing Provider  benzonatate (TESSALON) 100 MG capsule Take 1 capsule (100 mg total) by mouth 3 (three) times daily as needed for cough. Patient not taking: Reported on 09/26/2016 06/22/15   Dahlia Client Muthersbaugh, PA-C  doxycycline (VIBRAMYCIN) 100 MG capsule Take 1 capsule (100 mg total) by mouth 2 (two) times daily. Patient not taking: Reported on 09/26/2016 06/22/15   Dahlia Client Muthersbaugh, PA-C  HYDROcodone-acetaminophen (NORCO/VICODIN) 5-325 MG per tablet Take 1-2 tablets by mouth every 6 (six) hours as needed for moderate pain or severe pain. Patient not taking: Reported on 09/26/2016 06/22/15   Dahlia Client Muthersbaugh, PA-C  oxyCODONE-acetaminophen (PERCOCET) 5-325 MG per tablet Take 2 tablets by mouth every 4 (four) hours as needed for pain. Patient not taking: Reported on 09/26/2016 05/23/13   Geoffery Lyons, MD    Physical Exam: Vitals:   09/26/16 2215 09/26/16 2230 09/26/16 2245 09/26/16 2300  BP: 146/88 141/89 138/87 135/84  Pulse: 93 91 89 79  SpO2: 93% 93% 91%       Constitutional: Middle-aged male in significant distress unable to get comfortable  Vitals:   09/26/16 2215 09/26/16 2230 09/26/16 2245 09/26/16 2300  BP: 146/88 141/89 138/87 135/84  Pulse: 93 91 89 79  SpO2: 93% 93% 91%    Eyes: PERRL, lids and conjunctivae  normal ENMT: Mucous membranes are moist. Posterior pharynx clear of any exudate or lesions.  Poor dentition.  Neck: normal, supple, no masses, no thyromegaly Respiratory: clear to auscultation bilaterally, no wheezing, no crackles. Normal respiratory effort. No accessory muscle use.  Cardiovascular: Regular rate and rhythm, no murmurs / rubs / gallops. NoLower extremity edema. 2+ pedal pulses. No carotid bruits.  Abdomen: no tenderness, no masses palpated. No hepatosplenomegaly. Bowel sounds positive.    Musculoskeletal: no clubbing / cyanosis. Swelling noted of the left hand and arm to the  shoulder. Skin: Left index finger with 2 bite marks present. Neurologic: CN 2-12 grossly intact. Sensation intact, DTR normal. Strength 5/5 in all 4.  Psychiatric: Normal judgment and insight. Alert and oriented x 3. Normal mood.     Labs on Admission: I have personally reviewed following labs and imaging studies  CBC:  Recent Labs Lab 09/26/16 2248  WBC 14.4*  NEUTROABS 10.9*  HGB 14.1  HCT 41.4  MCV 96.5  PLT 219   Basic Metabolic Panel:  Recent Labs Lab 09/26/16 2248  NA 138  K 3.5  CL 110  CO2 19*  GLUCOSE 107*  BUN 8  CREATININE 0.76  CALCIUM 8.2*   GFR: CrCl cannot be calculated (Unknown ideal weight.). Liver Function Tests:  Recent Labs Lab 09/26/16 2248  AST 31  ALT 15*  ALKPHOS 55  BILITOT 0.5  PROT 6.1*  ALBUMIN 3.5   No results for input(s): LIPASE, AMYLASE in the last 168 hours. No results for input(s): AMMONIA in the last 168 hours. Coagulation Profile:  Recent Labs Lab 09/26/16 2248  INR 1.03   Cardiac Enzymes: No results for input(s): CKTOTAL, CKMB, CKMBINDEX, TROPONINI in the last 168 hours. BNP (last 3 results) No results for input(s): PROBNP in the last 8760 hours. HbA1C: No results for input(s): HGBA1C in the last 72 hours. CBG: No results for input(s): GLUCAP in the last 168 hours. Lipid Profile: No results for input(s): CHOL, HDL, LDLCALC, TRIG, CHOLHDL, LDLDIRECT in the last 72 hours. Thyroid Function Tests: No results for input(s): TSH, T4TOTAL, FREET4, T3FREE, THYROIDAB in the last 72 hours. Anemia Panel: No results for input(s): VITAMINB12, FOLATE, FERRITIN, TIBC, IRON, RETICCTPCT in the last 72 hours. Urine analysis: No results found for: COLORURINE, APPEARANCEUR, LABSPEC, PHURINE, GLUCOSEU, HGBUR, BILIRUBINUR, KETONESUR, PROTEINUR, UROBILINOGEN, NITRITE, LEUKOCYTESUR Sepsis Labs: No results found for this or any previous visit  (from the past 240 hour(s)).   Radiological Exams on Admission: No results found.    Assessment/Plan Copperhead snake bite: Acute. Patient reports that he was bitten by a compression technique left index finger. Patient given tetanus shot and 4 vials o crotalidae in the ED. - Admit to stepdown  - NS at 125 ml/hr - Check CBC, PT/INR, and fibrinogen every 4 hours - Check urinalysis, CPK,  and EKG - Elevate affected extremity  - Monitor pulses for signs of compartment syndrome and left arm - Oxycodone/Dilaudid IV prn moderate to severe pain respectively - Will reevaluate and give another 4 vials of crotalidae if need, and consider placing on maintenance of 2 vials every 6 hours as needed   Leukocytosis: Acute. WBC elevated at 14.4. Suspect secondary to above. - Continue to monitor  Alcohol abuse: Patient reports drinking 12 pack of beers on a daily basis. - CWIA protocol initiated with scheduled Ativan IV  Tobacco abuse - Nicotine patch while hospitalized - Counseled on the need of cessation of tobacco abuse  DVT prophylaxis: scd Code Status: Full  Family  Communication: No family present at bedside Disposition Plan: Likely discharge home once medically stable Consults called: None  Admission status: Observation stepdown  Clydie Braunondell A Smith MD [facility] Pager [phone]  If 7PM-7AM, please contact night-coverage [email or web address] Password [email or web address]  09/26/2016, 11:50 PM

## 2016-09-27 ENCOUNTER — Encounter (HOSPITAL_COMMUNITY): Payer: Self-pay | Admitting: Internal Medicine

## 2016-09-27 DIAGNOSIS — Z720 Tobacco use: Secondary | ICD-10-CM | POA: Diagnosis present

## 2016-09-27 DIAGNOSIS — T63001A Toxic effect of unspecified snake venom, accidental (unintentional), initial encounter: Secondary | ICD-10-CM | POA: Diagnosis present

## 2016-09-27 DIAGNOSIS — D72829 Elevated white blood cell count, unspecified: Secondary | ICD-10-CM | POA: Diagnosis present

## 2016-09-27 DIAGNOSIS — F1010 Alcohol abuse, uncomplicated: Secondary | ICD-10-CM | POA: Diagnosis present

## 2016-09-27 LAB — PROTIME-INR
INR: 1.01
INR: 1.09
INR: 0.96
PROTHROMBIN TIME: 14.1 s (ref 11.4–15.2)
Prothrombin Time: 13.3 seconds (ref 11.4–15.2)
Prothrombin Time: 12.8 seconds (ref 11.4–15.2)

## 2016-09-27 LAB — CBC WITH DIFFERENTIAL/PLATELET
BASOS ABS: 0 10*3/uL (ref 0.0–0.1)
BASOS ABS: 0 10*3/uL (ref 0.0–0.1)
BASOS ABS: 0.1 10*3/uL (ref 0.0–0.1)
BASOS PCT: 0 %
BASOS PCT: 0 %
BASOS PCT: 0 %
EOS PCT: 1 %
Eosinophils Absolute: 0 10*3/uL (ref 0.0–0.7)
Eosinophils Absolute: 0.1 10*3/uL (ref 0.0–0.7)
Eosinophils Absolute: 0.1 10*3/uL (ref 0.0–0.7)
Eosinophils Relative: 0 %
Eosinophils Relative: 1 %
HCT: 42.4 % (ref 39.0–52.0)
HEMATOCRIT: 41.2 % (ref 39.0–52.0)
HEMATOCRIT: 41.4 % (ref 39.0–52.0)
HEMOGLOBIN: 13.9 g/dL (ref 13.0–17.0)
Hemoglobin: 14 g/dL (ref 13.0–17.0)
Hemoglobin: 14.2 g/dL (ref 13.0–17.0)
LYMPHS PCT: 9 %
Lymphocytes Relative: 17 %
Lymphocytes Relative: 21 %
Lymphs Abs: 1.9 10*3/uL (ref 0.7–4.0)
Lymphs Abs: 2.1 10*3/uL (ref 0.7–4.0)
Lymphs Abs: 1.1 10*3/uL (ref 0.7–4.0)
MCH: 32.6 pg (ref 26.0–34.0)
MCH: 32.7 pg (ref 26.0–34.0)
MCH: 32.4 pg (ref 26.0–34.0)
MCHC: 34 g/dL (ref 30.0–36.0)
MCHC: 33.6 g/dL (ref 30.0–36.0)
MCHC: 33.5 g/dL (ref 30.0–36.0)
MCV: 96 fL (ref 78.0–100.0)
MCV: 97.4 fL (ref 78.0–100.0)
MCV: 96.8 fL (ref 78.0–100.0)
MONO ABS: 1.1 10*3/uL — AB (ref 0.1–1.0)
MONO ABS: 0.9 10*3/uL (ref 0.1–1.0)
MONOS PCT: 9 %
Monocytes Absolute: 1 10*3/uL (ref 0.1–1.0)
Monocytes Relative: 9 %
Monocytes Relative: 8 %
NEUTROS ABS: 8.6 10*3/uL — AB (ref 1.7–7.7)
NEUTROS ABS: 7 10*3/uL (ref 1.7–7.7)
NEUTROS ABS: 9.5 10*3/uL — AB (ref 1.7–7.7)
NEUTROS PCT: 69 %
Neutrophils Relative %: 74 %
Neutrophils Relative %: 82 %
PLATELETS: 255 10*3/uL (ref 150–400)
PLATELETS: 257 10*3/uL (ref 150–400)
Platelets: 246 10*3/uL (ref 150–400)
RBC: 4.29 MIL/uL (ref 4.22–5.81)
RBC: 4.25 MIL/uL (ref 4.22–5.81)
RBC: 4.38 MIL/uL (ref 4.22–5.81)
RDW: 12.9 % (ref 11.5–15.5)
RDW: 13.1 % (ref 11.5–15.5)
RDW: 13 % (ref 11.5–15.5)
WBC: 11.7 10*3/uL — ABNORMAL HIGH (ref 4.0–10.5)
WBC: 10.2 10*3/uL (ref 4.0–10.5)
WBC: 11.6 10*3/uL — ABNORMAL HIGH (ref 4.0–10.5)

## 2016-09-27 LAB — BASIC METABOLIC PANEL
ANION GAP: 9 (ref 5–15)
BUN: 7 mg/dL (ref 6–20)
CALCIUM: 8.2 mg/dL — AB (ref 8.9–10.3)
CHLORIDE: 109 mmol/L (ref 101–111)
CO2: 21 mmol/L — AB (ref 22–32)
CREATININE: 0.76 mg/dL (ref 0.61–1.24)
GLUCOSE: 100 mg/dL — AB (ref 65–99)
POTASSIUM: 4 mmol/L (ref 3.5–5.1)
SODIUM: 139 mmol/L (ref 135–145)

## 2016-09-27 LAB — FIBRINOGEN
Fibrinogen: 376 mg/dL (ref 210–475)
Fibrinogen: 366 mg/dL (ref 210–475)
Fibrinogen: 402 mg/dL (ref 210–475)

## 2016-09-27 LAB — CK: CK TOTAL: 274 U/L (ref 49–397)

## 2016-09-27 LAB — MRSA PCR SCREENING: MRSA by PCR: NEGATIVE

## 2016-09-27 MED ORDER — SODIUM CHLORIDE 0.9 % IV SOLN
INTRAVENOUS | Status: DC
  Administered 2016-09-27: 02:00:00 via INTRAVENOUS

## 2016-09-27 MED ORDER — SODIUM CHLORIDE 0.9 % IV SOLN
4.0000 | Freq: Once | INTRAVENOUS | Status: AC
  Administered 2016-09-27: 72 mL via INTRAVENOUS
  Filled 2016-09-27: qty 72

## 2016-09-27 MED ORDER — PANTOPRAZOLE SODIUM 40 MG PO TBEC
40.0000 mg | DELAYED_RELEASE_TABLET | Freq: Two times a day (BID) | ORAL | Status: DC
  Filled 2016-09-27: qty 1

## 2016-09-27 MED ORDER — ONDANSETRON HCL 4 MG PO TABS: 4.0000 mg | ORAL_TABLET | Freq: Four times a day (QID) | ORAL | Status: DC | PRN

## 2016-09-27 MED ORDER — FAMOTIDINE IN NACL 20-0.9 MG/50ML-% IV SOLN
20.0000 mg | Freq: Two times a day (BID) | INTRAVENOUS | Status: DC
  Administered 2016-09-27 – 2016-09-28 (×3): 20 mg via INTRAVENOUS
  Filled 2016-09-27 (×3): qty 50

## 2016-09-27 MED ORDER — LORAZEPAM 2 MG/ML IJ SOLN: 0.0000 mg | Freq: Two times a day (BID) | INTRAMUSCULAR | Status: DC

## 2016-09-27 MED ORDER — CROTALIDAE POLYVAL IMMUNE FAB IV SOLR
2.0000 | Freq: Four times a day (QID) | INTRAVENOUS | Status: AC
  Administered 2016-09-27 (×3): 36 mL via INTRAVENOUS
  Filled 2016-09-27 (×3): qty 36

## 2016-09-27 MED ORDER — LORAZEPAM 1 MG PO TABS: 1.0000 mg | ORAL_TABLET | Freq: Four times a day (QID) | ORAL | Status: DC | PRN

## 2016-09-27 MED ORDER — OXYCODONE-ACETAMINOPHEN 5-325 MG PO TABS
1.0000 | ORAL_TABLET | ORAL | Status: DC | PRN
  Administered 2016-09-27 – 2016-09-28 (×5): 2 via ORAL
  Filled 2016-09-27 (×6): qty 2

## 2016-09-27 MED ORDER — FOLIC ACID 1 MG PO TABS
1.0000 mg | ORAL_TABLET | Freq: Every day | ORAL | Status: DC
  Administered 2016-09-27 – 2016-09-28 (×2): 1 mg via ORAL
  Filled 2016-09-27 (×3): qty 1

## 2016-09-27 MED ORDER — HYDROMORPHONE HCL 1 MG/ML IJ SOLN
1.0000 mg | INTRAMUSCULAR | Status: DC | PRN
  Administered 2016-09-27: 2 mg via INTRAVENOUS
  Filled 2016-09-27: qty 2
  Filled 2016-09-27: qty 1

## 2016-09-27 MED ORDER — VITAMIN B-1 100 MG PO TABS
100.0000 mg | ORAL_TABLET | Freq: Every day | ORAL | Status: DC
Start: 2016-09-27 — End: 2016-09-28
  Administered 2016-09-27 – 2016-09-28 (×2): 100 mg via ORAL
  Filled 2016-09-27 (×3): qty 1

## 2016-09-27 MED ORDER — ONDANSETRON HCL 4 MG/2ML IJ SOLN
4.0000 mg | Freq: Four times a day (QID) | INTRAMUSCULAR | Status: DC | PRN
  Administered 2016-09-27: 4 mg via INTRAVENOUS
  Filled 2016-09-27 (×2): qty 2

## 2016-09-27 MED ORDER — ADULT MULTIVITAMIN W/MINERALS CH
1.0000 | ORAL_TABLET | Freq: Every day | ORAL | Status: DC
Start: 2016-09-27 — End: 2016-09-28
  Administered 2016-09-27 – 2016-09-28 (×2): 1 via ORAL
  Filled 2016-09-27 (×3): qty 1

## 2016-09-27 MED ORDER — NICOTINE 21 MG/24HR TD PT24
21.0000 mg | MEDICATED_PATCH | Freq: Every day | TRANSDERMAL | Status: DC
  Administered 2016-09-27 – 2016-09-28 (×3): 21 mg via TRANSDERMAL
  Filled 2016-09-27 (×4): qty 1

## 2016-09-27 MED ORDER — LORAZEPAM 2 MG/ML IJ SOLN: 1.0000 mg | Freq: Four times a day (QID) | INTRAMUSCULAR | Status: DC | PRN

## 2016-09-27 MED ORDER — LORAZEPAM 2 MG/ML IJ SOLN: 0.0000 mg | Freq: Four times a day (QID) | INTRAMUSCULAR | Status: DC

## 2016-09-27 MED ORDER — THIAMINE HCL 100 MG/ML IJ SOLN: 100.0000 mg | Freq: Every day | INTRAMUSCULAR | Status: DC

## 2016-09-27 MED ORDER — ALBUTEROL SULFATE (2.5 MG/3ML) 0.083% IN NEBU: 2.5000 mg | INHALATION_SOLUTION | RESPIRATORY_TRACT | Status: DC | PRN

## 2016-09-27 NOTE — Progress Notes (Signed)
MEDICATION RELATED CONSULT NOTE  Pharmacy Consult for Crofab Indication: snakebite   Vital Signs: Temp: 98.2 F (36.8 C) (10/11 0722) Temp Source: Oral (10/11 0722) BP: 141/89 (10/11 0722) Pulse Rate: 93 (10/11 0722) Intake/Output from previous day: 10/10 0701 - 10/11 0700 In: 1627.1 [P.O.:240; I.V.:887.1; IV Piggyback:500] Out: -  Intake/Output from this shift: Total I/O In: -  Out: 250 [Urine:250]  Labs:  Recent Labs  09/26/16 2248 09/27/16 0304 09/27/16 0705  WBC 14.4* 11.7* 10.2  HGB 14.1 14.0 13.9  HCT 41.4 41.2 41.4  PLT 219 255 246  APTT 38*  --   --   CREATININE 0.76 0.76  --   ALBUMIN 3.5  --   --   PROT 6.1*  --   --   AST 31  --   --   ALT 15*  --   --   ALKPHOS 55  --   --   BILITOT 0.5  --   --    CrCl cannot be calculated (Unknown ideal weight.).    Medical History: Past Medical History:  Diagnosis Date  . Alcohol abuse   . Tobacco abuse      Assessment: 40 yo male presented to the hospital after being bitten by a copperhead. He was bitten in the R pointer finger last night around 2000. He received 6 vials of crofab x1 at 2300, was still having increased swelling and received another 6 vials at 0300. This morning, his hand, arm and shoulder remain swollen. His hand and arm feel tight and he is having difficulty bending his R fingers. He is still in pain, he has been receiving Dilaudid for this. He did have some emesis this morning, but that could be multifactorial as he is also a consistent alcohol user. I have been in touch with Poison Control. Due to the severity of symptoms and swelling progression will administer the maintenance doses of Crofab.   Plan:  Crofab 2 vials q6h x 3 doses Monitor for progression of pain and swelling   Baldemar FridayMasters, Nicole Hafley M 09/27/2016,8:37 AM

## 2016-09-27 NOTE — Progress Notes (Signed)
PROGRESS NOTE    Marcus Zamora  YQM:[id number] DOB: Jan 03, 1976 DOA: 09/26/2016 PCP: No PCP Per Patient   Outpatient Specialists:    Brief Narrative:  Marcus Zamora is a 40 y.o. male with medical history significant of alcohol and tobacco abuse; who presents after getting bit by a copperhead snake. Patient notes that he was sitting on  front porch of his house when one of his boys notice a snake in the ER. He normally catches black snakes and so he went after a snake and chased him down before he was able to get into the basement of her home. However, once he wanted to snake he was bitten twice on his left index finger. He merely had acute onset of pain and swelling in the left hand. Pain is rated as a 10 out of 10 on the pain scale. Never reports being previously bitten by a snake before. Swelling progressively worsened up to his shoulder. Denies any chest pain, shortness breath, weakness, dizziness, headache, nausea, vomiting, or diarrhea at this time. Patient notes that he smokes almost a pack cigarettes per day on average and drinks 12 pack beer on a daily basis.    Assessment & Plan:   Principal Problem:   Snake bite poisoning Active Problems:   Snake envenomation   Leukocytosis   Alcohol abuse   Tobacco abuse   Copperhead snake bite: Acute. Patient reports that he was bitten by a compression technique left index finger. Patient given tetanus shot -s/p 4 vials x 2 of crofab - NS at 125 ml/hr - Check CBC, PT/INR, and fibrinogen every 4 hours - Elevate affected extremity 18 inch above heart-- no ice, no compression -measure circumferences of forearm and upperarm at same area, mark area of progression -call placed to poison control - Monitor pulses for signs of compartment syndrome in  left arm-- may need vascular consult - Oxycodone/Dilaudid IV prn moderate to severe pain respectively -appreciate pharmacy management of more crofab  Leukocytosis: Acute. WBC elevated at 14.4.  Suspect secondary to above. - decreaseing  Alcohol abuse: Patient reports drinking 12 pack of beers on a daily basis. - CWIA protocol initiated with scheduled Ativan IV  Tobacco abuse - Nicotine patch while hospitalized - Counseled on the need of cessation of tobacco abuse  Vomiting -patient states he has "coffee ground emesis" at home on occasion -pepcid IV -monitor CBC -GI consult when stable if continues  DVT prophylaxis:  SCD's  Code Status: Full Code   Family Communication: patient  Disposition Plan:     Consultants:   Poison control  Procedures:        Subjective: Swelling appears to be moving more proximally still-- to armpit area  Objective: Vitals:   09/27/16 0030 09/27/16 0100 09/27/16 0352 09/27/16 0722  BP: 148/99 141/99 130/85 (!) 141/89  Pulse: 85 81 75 93  Resp:   16 15  Temp:   97.7 F (36.5 C) 98.2 F (36.8 C)  TempSrc:   Oral Oral  SpO2:  93% 97% 96%    Intake/Output Summary (Last 24 hours) at 09/27/16 1023 Last data filed at 09/27/16 0900  Gross per 24 hour  Intake          1867.09 ml  Output              250 ml  Net          1617.09 ml   There were no vitals filed for this visit.  Examination:  General exam:  appears in pain Respiratory system: Clear to auscultation. Respiratory effort normal. Cardiovascular system: S1 & S2 heard, RRR. No JVD, murmurs, rubs, gallops or clicks. No pedal edema. Gastrointestinal system: Abdomen is nondistended, soft and nontender. No organomegaly or masses felt. Normal bowel sounds heard. Central nervous system: Alert and oriented. No focal neurological deficits. Extremities: left hand including fingers, forearm, upper arm and into shoulder swollen and tight   Data Reviewed: I have personally reviewed following labs and imaging studies  CBC:  Recent Labs Lab 09/26/16 2248 09/27/16 0304 09/27/16 0705  WBC 14.4* 11.7* 10.2  NEUTROABS 10.9* 8.6* 7.0  HGB 14.1 14.0 13.9  HCT 41.4  41.2 41.4  MCV 96.5 96.0 97.4  PLT 219 255 246   Basic Metabolic Panel:  Recent Labs Lab 09/26/16 2248 09/27/16 0304  NA 138 139  K 3.5 4.0  CL 110 109  CO2 19* 21*  GLUCOSE 107* 100*  BUN 8 7  CREATININE 0.76 0.76  CALCIUM 8.2* 8.2*   GFR: CrCl cannot be calculated (Unknown ideal weight.). Liver Function Tests:  Recent Labs Lab 09/26/16 2248  AST 31  ALT 15*  ALKPHOS 55  BILITOT 0.5  PROT 6.1*  ALBUMIN 3.5   No results for input(s): LIPASE, AMYLASE in the last 168 hours. No results for input(s): AMMONIA in the last 168 hours. Coagulation Profile:  Recent Labs Lab 09/26/16 2248 09/27/16 0304 09/27/16 0705  INR 1.03 1.01 1.09   Cardiac Enzymes:  Recent Labs Lab 09/27/16 0304  CKTOTAL 274   BNP (last 3 results) No results for input(s): PROBNP in the last 8760 hours. HbA1C: No results for input(s): HGBA1C in the last 72 hours. CBG: No results for input(s): GLUCAP in the last 168 hours. Lipid Profile: No results for input(s): CHOL, HDL, LDLCALC, TRIG, CHOLHDL, LDLDIRECT in the last 72 hours. Thyroid Function Tests: No results for input(s): TSH, T4TOTAL, FREET4, T3FREE, THYROIDAB in the last 72 hours. Anemia Panel: No results for input(s): VITAMINB12, FOLATE, FERRITIN, TIBC, IRON, RETICCTPCT in the last 72 hours. Urine analysis: No results found for: COLORURINE, APPEARANCEUR, LABSPEC, PHURINE, GLUCOSEU, HGBUR, BILIRUBINUR, KETONESUR, PROTEINUR, UROBILINOGEN, NITRITE, LEUKOCYTESUR    Recent Results (from the past 240 hour(s))  MRSA PCR Screening     Status: None   Collection Time: 09/27/16  1:23 AM  Result Value Ref Range Status   MRSA by PCR NEGATIVE NEGATIVE Final    Comment:        The GeneXpert MRSA Assay (FDA approved for NASAL specimens only), is one component of a comprehensive MRSA colonization surveillance program. It is not intended to diagnose MRSA infection nor to guide or monitor treatment for MRSA infections.        Anti-infectives    None       Radiology Studies: No results found.      Scheduled Meds: . crotalidae immune fab (CROFAB) infusion  2 vial Intravenous Q6H  . famotidine (PEPCID) IV  20 mg Intravenous Q12H  . folic acid  1 mg Oral Daily  . LORazepam  0-4 mg Intravenous Q6H   Followed by  . [START ON 09/29/2016] LORazepam  0-4 mg Intravenous Q12H  . multivitamin with minerals  1 tablet Oral Daily  . nicotine  21 mg Transdermal Daily  . thiamine  100 mg Oral Daily   Continuous Infusions: . sodium chloride 125 mL/hr at 09/27/16 0143     LOS: 0 days    Time spent: 35 min    Sameen Leas U Dameon Soltis, DO [facility] Pager [phone]  If 7PM-7AM, please contact night-coverage [email or web address] Password [email or web address] 09/27/2016, 10:23 AM

## 2016-09-27 NOTE — Progress Notes (Signed)
Pt transported from ED to 3s01 after report given.  Pt arrives in severe pain. ED RN states pain/swelling has advanced from mid forearm to upper arm area.  Pain meds given, arm elevated.  Call bell within reach and pt oriented to surroundings.  Will continue to monitor.

## 2016-09-27 NOTE — Progress Notes (Signed)
Swelling has not advanced passed marked area on upper arm,  Bicep measurements 13inches,  Forearm 10inches. Left arm remains elevated and pain med given.

## 2016-09-27 NOTE — Care Management Note (Signed)
Case Management Note  Patient Details  Name: Darryl LentJames H Topham MRN: [id number] Date of Birth: [birth year]  Subjective/Objective:   Presents with snake bite, pta indep.  Has no PCP, will need to call [facility] on Monday 10/16 to schedule hospital follow up apt.  He states he has transportation at discharge, may need ast with medications. NCM will cont to follow for dc needs.                  Action/Plan:   Expected Discharge Date:                  Expected Discharge Plan:  Home/Self Care  In-House Referral:     Discharge planning Services  CM Consult  Post Acute Care Choice:    Choice offered to:     DME Arranged:    DME Agency:     HH Arranged:    HH Agency:     Status of Service:  In process, will continue to follow  If discussed at Long Length of Stay Meetings, dates discussed:    Additional Comments:  Leone Havenaylor, Jassiel Flye Clinton, RN 09/27/2016, 4:56 PM

## 2016-09-28 LAB — CBC WITH DIFFERENTIAL/PLATELET
BASOS ABS: 0.1 10*3/uL (ref 0.0–0.1)
Basophils Relative: 1 %
Eosinophils Absolute: 0.3 10*3/uL (ref 0.0–0.7)
Eosinophils Relative: 3 %
HEMATOCRIT: 36.5 % — AB (ref 39.0–52.0)
Hemoglobin: 12.2 g/dL — ABNORMAL LOW (ref 13.0–17.0)
LYMPHS ABS: 1.7 10*3/uL (ref 0.7–4.0)
LYMPHS PCT: 19 %
MCH: 32.4 pg (ref 26.0–34.0)
MCHC: 33.4 g/dL (ref 30.0–36.0)
MCV: 96.8 fL (ref 78.0–100.0)
Monocytes Absolute: 0.8 10*3/uL (ref 0.1–1.0)
Monocytes Relative: 9 %
NEUTROS ABS: 6.1 10*3/uL (ref 1.7–7.7)
Neutrophils Relative %: 68 %
Platelets: 224 10*3/uL (ref 150–400)
RBC: 3.77 MIL/uL — AB (ref 4.22–5.81)
RDW: 12.8 % (ref 11.5–15.5)
WBC: 9 10*3/uL (ref 4.0–10.5)

## 2016-09-28 LAB — URINALYSIS, ROUTINE W REFLEX MICROSCOPIC
Bilirubin Urine: NEGATIVE
Glucose, UA: NEGATIVE mg/dL
HGB URINE DIPSTICK: NEGATIVE
Ketones, ur: NEGATIVE mg/dL
LEUKOCYTES UA: NEGATIVE
Nitrite: NEGATIVE
PROTEIN: NEGATIVE mg/dL
SPECIFIC GRAVITY, URINE: 1.007 (ref 1.005–1.030)
pH: 6 (ref 5.0–8.0)

## 2016-09-28 LAB — PROTIME-INR
INR: 1.04
Prothrombin Time: 13.6 seconds (ref 11.4–15.2)

## 2016-09-28 LAB — FIBRINOGEN: FIBRINOGEN: 323 mg/dL (ref 210–475)

## 2016-09-28 MED ORDER — OXYCODONE-ACETAMINOPHEN 5-325 MG PO TABS: 1.0000 | ORAL_TABLET | ORAL | 0 refills | Status: AC | PRN

## 2016-09-28 MED ORDER — FAMOTIDINE 20 MG PO TABS: 20.0000 mg | ORAL_TABLET | Freq: Two times a day (BID) | ORAL | 0 refills | Status: AC

## 2016-09-28 MED ORDER — FAMOTIDINE 20 MG PO TABS: 20.0000 mg | ORAL_TABLET | Freq: Two times a day (BID) | ORAL | Status: DC

## 2016-09-28 NOTE — Progress Notes (Signed)
Received order to D/C patient. Provided pt with education on materials including poison control education sheet. Removed PIV, answered all patients questions. Pt escorted out with all belongings with this RN.

## 2016-09-28 NOTE — Discharge Instructions (Signed)
Snake Bite °Snakes may be poisonous (venomous) or nonpoisonous (nonvenomous). A bite from a nonvenomous snake may cause a wound to the skin and possibly to the deeper tissues beneath the skin. A venomous snake will cause a wound and may also inject poison (venom) into the wound.  °The effects of snake venom vary depending on the type of snake. In some cases, the effects can be extremely serious or even deadly. A bite from a venomous snake is a medical emergency. Treatment may require the use of antivenom medicine. °SYMPTOMS  °Symptoms of a snake bite vary depending on the type of snake, whether the snake is venomous, and the severity of the bite. Symptoms for both a venomous or nonvenomous snake may include:  °· Pain, redness, and swelling at the site of the bite. °· Skin discoloration at the site of the bite.   °· A feeling of nervousness.   °Symptoms of a venomous snake bite may also include:  °· Increasing pain and swelling. °· Severe anxiety or confusion. °· Blood blisters or purple spots in the bite area.   °· Nausea and vomiting.   °· Numbness or tingling.   °· Muscle weakness.   °· Excessive fatigue or drowsiness. °· Excessive sweating.   °· Difficulty breathing.   °· Blurred vision.   °· Bruising and bleeding at the site of the bite. °· Feeling faint or light-headed. °In some cases, symptoms do not develop until a few hours after the bite.  °DIAGNOSIS  °This condition may be diagnosed based on symptoms and a physical exam. Your health care provider will examine the bite area and ask for details about the snake to help determine whether it is venomous. You may also have tests, including blood tests. °TREATMENT  °Treatment depends on the severity of the bite and whether the snake is venomous. °· Treatment for nonvenomous snake bites may involve basic wound care. This often includes cleaning the wound and applying a bandage (dressing). In some cases, antibiotic medicine or a tetanus shot may be  given. °· Treatment for venomous snake bites may include antivenom medicine in addition to wound care. This medicine needs to be given as soon as possible after the bite. Other treatments may be needed to help control symptoms as they develop. You may need to stay in a hospital so your condition can be monitored. °HOME CARE INSTRUCTIONS  °Wound Care °· Follow instructions from your health care provider about how to take care of your wound. Make sure you:   °¨ Wash your hands with soap and water before you change your dressing. If soap and water are not available, use hand sanitizer. °¨ Change your dressing as told by your health care provider. °· Keep the bite area clean and dry. Wash the bite area daily with soap and water or an antiseptic as told by your health care provider. °· Check your wound every day for signs of infection. Watch for:   °¨  Redness, swelling, or pain that is getting worse.   °¨  Fluid, blood, or pus.   °· If you develop blistering at the site of the bite, protect the blisters from breaking. Do not attempt to open a blister. °Medicines °· Take or apply over-the-counter and prescription medicines only as told by your health care provider.   °· If you were prescribed an antibiotic, take or apply it as told by your health care provider. Do not stop using the antibiotic even if your condition improves. °General Instructions °· Keep the affected area raised (elevated) above the level of your heart while you are sitting   or lying down, if possible. °· Keep all follow-up visits as told by your health care provider. This is important. °SEEK MEDICAL CARE IF:  °· You have increased redness, swelling, or pain at the site of your wound. °· You have fluid, blood, or pus coming from your wound. °· You have a fever. °SEEK IMMEDIATE MEDICAL CARE IF:  °· You develop blood blisters or purple spots in the bite area.   °· You have nausea or vomiting.   °· You have numbness or tingling.   °· You have excessive  sweating.   °· You have trouble breathing.   °· You have vision problems.   °· You feel very confused. °· You feel faint or light-headed.   °  °This information is not intended to replace advice given to you by your health care provider. Make sure you discuss any questions you have with your health care provider. °  °Document Released: 12/01/2000 Document Revised: 08/25/2015 Document Reviewed: 04/21/2015 °Elsevier Interactive Patient Education ©2016 [employer] ° °

## 2016-09-28 NOTE — Discharge Summary (Signed)
Physician Discharge Summary  Marcus Zamora:[id number] DOB: 1976/09/13 DOA: 09/26/2016  PCP: No PCP Per Patient  Admit date: 09/26/2016 Discharge date: 09/28/2016   Recommendations for Outpatient Follow-Up:   1. [facility]- instructed to return to ER with signs of infection   Discharge Diagnosis:   Principal Problem:   Snake bite poisoning Active Problems:   Snake envenomation   Leukocytosis   Alcohol abuse   Tobacco abuse   Discharge disposition:  Home.  Discharge Condition: Improved.  Diet recommendation: Low sodium, heart healthy.    Wound care: wash with soap and water   History of Present Illness:   Marcus Zamora is a 40 y.o. male with medical history significant of alcohol and tobacco abuse; who presents after getting bit by a copperhead snake. Patient notes that he was sitting on  front porch of his house when one of his boys notice a snake in the ER. He normally catches black snakes and so he went after a snake and chased him down before he was able to get into the basement of her home. However, once he wanted to snake he was bitten twice on his left index finger. He merely had acute onset of pain and swelling in the left hand. Pain is rated as a 10 out of 10 on the pain scale. Never reports being previously bitten by a snake before. Swelling progressively worsened up to his shoulder. Denies any chest pain, shortness breath, weakness, dizziness, headache, nausea, vomiting, or diarrhea at this time. Patient notes that he smokes almost a pack cigarettes per day on average and drinks 12 pack beer on a daily basis.   Hospital Course by Problem:   Copperhead snake bite: Acute. Patient reports that he was bitten by a compression technique left index finger. Patient given tetanus shot -s/p multiple vials of crofab - worked with poison control - Elevate affected extremity 18 inch above heart-- no ice, no compression -call placed to  poison control - Oxycodone PRN  Leukocytosis: Acute. WBC elevated at 14.4. Suspect secondary to above. - decreaseing  Alcohol abuse: Patient reports drinking 12 pack of beers on a daily basis. - not scoring on CIWA  Tobacco abuse - Nicotine patch while hospitalized - Counseled on the need of cessation of tobacco abuse  Vomiting -outpatient GI follow up  OTC PPI     Medical Consultants:    Poison Control   Discharge Exam:   Vitals:   09/28/16 0313 09/28/16 0700  BP: 99/72 (!) 154/87  Pulse: 75 75  Resp:  13  Temp: 98.7 F (37.1 C) 99.1 F (37.3 C)   Vitals:   09/27/16 2036 09/27/16 2351 09/28/16 0313 09/28/16 0700  BP: (!) 144/85 133/85 99/72 (!) 154/87  Pulse: 75 64 75 75  Resp: 13 12  13   Temp: 98 F (36.7 C) 97.8 F (36.6 C) 98.7 F (37.1 C) 99.1 F (37.3 C)  TempSrc: Oral Oral Oral Oral  SpO2: 96% 93% 92% 94%    Gen:  NAD Swelling and pain much improved-- able to move all fingers   The results of significant diagnostics from this hospitalization (including imaging, microbiology, ancillary and laboratory) are listed below for reference.     Procedures and Diagnostic Studies:   No results found.   Labs:   Basic Metabolic Panel:  Recent Labs Lab 09/26/16 2248 09/27/16 0304  NA 138 139  K 3.5 4.0  CL 110 109  CO2 19* 21*  GLUCOSE 107* 100*  BUN 8 7  CREATININE 0.76 0.76  CALCIUM 8.2* 8.2*   GFR CrCl cannot be calculated (Unknown ideal weight.). Liver Function Tests:  Recent Labs Lab 09/26/16 2248  AST 31  ALT 15*  ALKPHOS 55  BILITOT 0.5  PROT 6.1*  ALBUMIN 3.5   No results for input(s): LIPASE, AMYLASE in the last 168 hours. No results for input(s): AMMONIA in the last 168 hours. Coagulation profile  Recent Labs Lab 09/26/16 2248 09/27/16 0304 09/27/16 0705 09/27/16 0950 09/28/16 0208  INR 1.03 1.01 1.09 0.96 1.04    CBC:  Recent Labs Lab 09/26/16 2248 09/27/16 0304 09/27/16 0705 09/27/16 0950  09/28/16 0208  WBC 14.4* 11.7* 10.2 11.6* 9.0  NEUTROABS 10.9* 8.6* 7.0 9.5* 6.1  HGB 14.1 14.0 13.9 14.2 12.2*  HCT 41.4 41.2 41.4 42.4 36.5*  MCV 96.5 96.0 97.4 96.8 96.8  PLT 219 255 246 257 224   Cardiac Enzymes:  Recent Labs Lab 09/27/16 0304  CKTOTAL 274   BNP: Invalid input(s): POCBNP CBG: No results for input(s): GLUCAP in the last 168 hours. D-Dimer No results for input(s): DDIMER in the last 72 hours. Hgb A1c No results for input(s): HGBA1C in the last 72 hours. Lipid Profile No results for input(s): CHOL, HDL, LDLCALC, TRIG, CHOLHDL, LDLDIRECT in the last 72 hours. Thyroid function studies No results for input(s): TSH, T4TOTAL, T3FREE, THYROIDAB in the last 72 hours.  Invalid input(s): FREET3 Anemia work up No results for input(s): VITAMINB12, FOLATE, FERRITIN, TIBC, IRON, RETICCTPCT in the last 72 hours. Microbiology Recent Results (from the past 240 hour(s))  MRSA PCR Screening     Status: None   Collection Time: 09/27/16  1:23 AM  Result Value Ref Range Status   MRSA by PCR NEGATIVE NEGATIVE Final    Comment:        The GeneXpert MRSA Assay (FDA approved for NASAL specimens only), is one component of a comprehensive MRSA colonization surveillance program. It is not intended to diagnose MRSA infection nor to guide or monitor treatment for MRSA infections.      Discharge Instructions:   Discharge Instructions    Diet - low sodium heart healthy    Complete by:  As directed    Discharge instructions    Complete by:  As directed    Keep arm elevated 18 inches above heart-- do not compress Wash soap/water Watch for signs of infection Cut back on alcohol-- will need outpatient GI evaluation if continues to vomit   Increase activity slowly    Complete by:  As directed        Medication List    STOP taking these medications   benzonatate 100 MG capsule Commonly known as:  TESSALON   doxycycline 100 MG capsule Commonly known as:   VIBRAMYCIN   HYDROcodone-acetaminophen 5-325 MG tablet Commonly known as:  NORCO/VICODIN     TAKE these medications   famotidine 20 MG tablet Commonly known as:  PEPCID Take 1 tablet (20 mg total) by mouth 2 (two) times daily.   oxyCODONE-acetaminophen 5-325 MG tablet Commonly known as:  PERCOCET/ROXICET Take 1-2 tablets by mouth every 4 (four) hours as needed for moderate pain. What changed:  how much to take  reasons to take this      Follow-up Information    [facility] .   Why:  call on Monday 10/16 to schedule hospital follow up apt.   Contact information: [address] [employer]  [place] [id number] [phone]           Time coordinating discharge: 35 min  Signed:  Chad Donoghue U Ariea Rochin   [facility] 09/28/2016, 10:08 AM

## 2016-09-28 NOTE — Care Management Note (Signed)
Case Management Note  Patient Details  Name: Marcus Zamora MRN: [id number] Date of Birth: [birth year]  Subjective/Objective:   Presents with snake bite, NCM scheduled an apt with [facility] for 10/17 at 9:30 for hospital follow up for patient, he will be able to get his pepcid from clinic for $4, but will need to get pain meds from [facility].  Patient is for dc today.                 Action/Plan:   Expected Discharge Date:                  Expected Discharge Plan:  Home/Self Care  In-House Referral:     Discharge planning Services  CM Consult, Follow-up appt scheduled, Medication Assistance, Indigent [facility]  Post Acute Care Choice:    Choice offered to:     DME Arranged:    DME Agency:     HH Arranged:    HH Agency:     Status of Service:  Completed, signed off  If discussed at Microsoft[employer] Length of Stay Meetings, dates discussed:    Additional Comments:  Leone Havenaylor, Jill Ruppe Clinton, RN 09/28/2016, 11:39 AM

## 2016-10-03 ENCOUNTER — Inpatient Hospital Stay: Payer: Self-pay

## 2017-02-25 ENCOUNTER — Encounter (HOSPITAL_COMMUNITY): Payer: Self-pay

## 2017-02-25 ENCOUNTER — Emergency Department (HOSPITAL_COMMUNITY)
Admission: EM | Admit: 2017-02-25 | Discharge: 2017-02-26 | Disposition: A | Discharge status: Home / Self Care | Payer: Self-pay | Attending: Emergency Medicine | Admitting: Emergency Medicine

## 2017-02-25 ENCOUNTER — Emergency Department (HOSPITAL_COMMUNITY): Payer: Self-pay

## 2017-02-25 DIAGNOSIS — Z7982 Long term (current) use of aspirin: Secondary | ICD-10-CM | POA: Insufficient documentation

## 2017-02-25 DIAGNOSIS — K2970 Gastritis, unspecified, without bleeding: Secondary | ICD-10-CM | POA: Insufficient documentation

## 2017-02-25 DIAGNOSIS — Z720 Tobacco use: Secondary | ICD-10-CM

## 2017-02-25 DIAGNOSIS — F17200 Nicotine dependence, unspecified, uncomplicated: Secondary | ICD-10-CM | POA: Insufficient documentation

## 2017-02-25 DIAGNOSIS — J441 Chronic obstructive pulmonary disease with (acute) exacerbation: Secondary | ICD-10-CM | POA: Insufficient documentation

## 2017-02-25 LAB — CBC WITH DIFFERENTIAL/PLATELET
Basophils Absolute: 0.1 10*3/uL (ref 0.0–0.1)
Basophils Relative: 1 %
Eosinophils Absolute: 0.2 10*3/uL (ref 0.0–0.7)
Eosinophils Relative: 2 %
HCT: 39.7 % (ref 39.0–52.0)
Hemoglobin: 13.6 g/dL (ref 13.0–17.0)
Lymphocytes Relative: 26 %
Lymphs Abs: 2.6 10*3/uL (ref 0.7–4.0)
MCH: 32 pg (ref 26.0–34.0)
MCHC: 34.3 g/dL (ref 30.0–36.0)
MCV: 93.4 fL (ref 78.0–100.0)
Monocytes Absolute: 1 10*3/uL (ref 0.1–1.0)
Monocytes Relative: 10 %
Neutro Abs: 5.9 10*3/uL (ref 1.7–7.7)
Neutrophils Relative %: 61 %
Platelets: 255 10*3/uL (ref 150–400)
RBC: 4.25 MIL/uL (ref 4.22–5.81)
RDW: 13.9 % (ref 11.5–15.5)
WBC: 9.8 10*3/uL (ref 4.0–10.5)

## 2017-02-25 LAB — URINALYSIS, ROUTINE W REFLEX MICROSCOPIC
Bilirubin Urine: NEGATIVE
Glucose, UA: NEGATIVE mg/dL
Hgb urine dipstick: NEGATIVE
Ketones, ur: NEGATIVE mg/dL
Leukocytes, UA: NEGATIVE
Nitrite: NEGATIVE
Protein, ur: NEGATIVE mg/dL
Specific Gravity, Urine: 1.002 — ABNORMAL LOW (ref 1.005–1.030)
pH: 6 (ref 5.0–8.0)

## 2017-02-25 LAB — COMPREHENSIVE METABOLIC PANEL
ALT: 18 U/L (ref 17–63)
AST: 37 U/L (ref 15–41)
Albumin: 3.8 g/dL (ref 3.5–5.0)
Alkaline Phosphatase: 57 U/L (ref 38–126)
Anion gap: 11 (ref 5–15)
BUN: 7 mg/dL (ref 6–20)
CO2: 25 mmol/L (ref 22–32)
Calcium: 9.2 mg/dL (ref 8.9–10.3)
Chloride: 100 mmol/L — ABNORMAL LOW (ref 101–111)
Creatinine, Ser: 0.85 mg/dL (ref 0.61–1.24)
GFR calc Af Amer: >60 mL/min (ref >60)
GFR calc non Af Amer: >60 mL/min (ref >60)
Glucose, Bld: 80 mg/dL (ref 65–99)
Potassium: 3.8 mmol/L (ref 3.5–5.1)
Sodium: 136 mmol/L (ref 135–145)
Total Bilirubin: 0.4 mg/dL (ref 0.3–1.2)
Total Protein: 7.2 g/dL (ref 6.5–8.1)

## 2017-02-25 LAB — I-STAT TROPONIN, ED: Troponin i, poc: 0 ng/mL (ref 0.00–0.08)

## 2017-02-25 LAB — LIPASE, BLOOD: Lipase: 42 U/L (ref 11–51)

## 2017-02-25 LAB — I-STAT CG4 LACTIC ACID, ED: Lactic Acid, Venous: 1.82 mmol/L (ref 0.5–1.9)

## 2017-02-25 MED ORDER — IPRATROPIUM-ALBUTEROL 0.5-2.5 (3) MG/3ML IN SOLN
3.0000 mL | Freq: Once | RESPIRATORY_TRACT | Status: AC
Start: 2017-02-25 — End: 2017-02-25
  Administered 2017-02-25: 3 mL via RESPIRATORY_TRACT
  Filled 2017-02-25: qty 3

## 2017-02-25 MED ORDER — PREDNISONE 20 MG PO TABS
60.0000 mg | ORAL_TABLET | Freq: Once | ORAL | Status: AC
Start: 2017-02-25 — End: 2017-02-25
  Administered 2017-02-25: 60 mg via ORAL
  Filled 2017-02-25: qty 3

## 2017-02-25 MED ORDER — GI COCKTAIL ~~LOC~~
30.0000 mL | Freq: Once | ORAL | Status: AC
  Administered 2017-02-25: 30 mL via ORAL
  Filled 2017-02-25: qty 30

## 2017-02-25 MED ORDER — ALBUTEROL SULFATE (2.5 MG/3ML) 0.083% IN NEBU
5.0000 mg | INHALATION_SOLUTION | Freq: Once | RESPIRATORY_TRACT | Status: AC
  Administered 2017-02-25: 5 mg via RESPIRATORY_TRACT
  Filled 2017-02-25: qty 6

## 2017-02-25 MED ORDER — IPRATROPIUM BROMIDE 0.02 % IN SOLN
0.5000 mg | Freq: Once | RESPIRATORY_TRACT | Status: AC
  Administered 2017-02-25: 0.5 mg via RESPIRATORY_TRACT
  Filled 2017-02-25: qty 2.5

## 2017-02-25 MED ORDER — FAMOTIDINE 20 MG PO TABS
40.0000 mg | ORAL_TABLET | Freq: Once | ORAL | Status: AC
  Administered 2017-02-25: 40 mg via ORAL
  Filled 2017-02-25: qty 2

## 2017-02-25 MED ORDER — SUCRALFATE 1 G PO TABS
2.0000 g | ORAL_TABLET | Freq: Once | ORAL | Status: AC
  Administered 2017-02-25: 2 g via ORAL
  Filled 2017-02-25: qty 2

## 2017-02-25 NOTE — ED Notes (Addendum)
Pt ambulated approx 25 steps to bathroom without assistance or difficulty. Pt denied weakness, dizziness, lightheadedness.  O2 sat: 97% HR: 112bpm

## 2017-02-25 NOTE — ED Triage Notes (Addendum)
Pt endorses productive cough with clear sputum x 2 days and abd pain that began today and states "I think I have a bleeding ulcer because I coughed up some blood earlier and I 've had them before and I think I have pneumonia" Pt coughing in triage. Denies fever/chills or bloody stools. VSS. Afebrile in triage. Pt states that he has been taking goody powders "more than I should for my back"

## 2017-02-25 NOTE — ED Provider Notes (Signed)
[facility]-EMERGENCY DEPT Provider Note   CSN: [id number] Arrival date & time: 02/25/17  2048     History   Chief Complaint Chief Complaint  Patient presents with  . Cough  . Abdominal Pain    HPI Marcus Zamora is a 41 y.o. male who presents with chief complaint of epigastric abdominal pain and cough. The patient states that he has had a productive cough over the past 3 days and feels that he has "a touch of pneumonia." He has been using his "old lady's" inhaler without significant relief.. And has also had intermittent epigastric abdominal pain. He states he has been "cutting back" on his Goody's powders use. He states he is only using about 2 a day but used to use far more. He also drinks about a 12 pack of beer daily and smokes 2 packs of cigarettes a day for "my whole life." She denies fevers. He had one episode of vomiting dark vomitus which he thinks was blood. He also vomited some phlegm. He has persistent epigastric abdominal pain since that time, but denies chest pain or shortness of breath.  HPI  Past Medical History:  Diagnosis Date  . Alcohol abuse   . Tobacco abuse     Patient Active Problem List   Diagnosis Date Noted  . 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 Leukocytosis 09/27/2016  . Alcohol abuse 09/27/2016  . Tobacco abuse 09/27/2016  . Snake envenomation 09/26/2016    History reviewed. No pertinent surgical history.     Home Medications    Prior to Admission medications   Medication Sig Start Date End Date Taking? Authorizing Provider  Aspirin-Acetaminophen-Caffeine (GOODY HEADACHE PO) Take 1 packet by mouth 2 (two) times daily as needed (pain).   Yes Historical Provider, MD  ibuprofen (ADVIL,MOTRIN) 200 MG tablet Take 400 mg by mouth daily as needed (pain).   Yes Historical Provider, MD  famotidine (PEPCID) 20 MG tablet Take 1 tablet (20 mg total) by mouth 2 (two) times daily. Patient not taking: Reported on 02/25/2017 09/28/16   Joseph Art, DO    oxyCODONE-acetaminophen (PERCOCET/ROXICET) 5-325 MG tablet Take 1-2 tablets by mouth every 4 (four) hours as needed for moderate pain. Patient not taking: Reported on 02/25/2017 09/28/16   Joseph Art, DO    Family History History reviewed. No pertinent family history.  Social History Social History  Substance Use Topics  . Smoking status: Current Every Day Smoker    Packs/day: 2.00  . Smokeless tobacco: Never Used  . Alcohol use Yes     Comment: 12 pack of beer per day     Allergies   Patient has no known allergies.   Review of Systems Review of Systems Ten systems reviewed and are negative for acute change, except as noted in the HPI.    Physical Exam Updated Vital Signs BP 124/76 (BP Location: Right Arm)   Pulse 92   Temp 97.5 F (36.4 C) (Oral)   Resp 20   Ht 6\' 1"  (1.854 m)   Wt 72.6 kg   SpO2 97%   BMI 21.11 kg/m   Physical Exam  Constitutional: He is oriented to person, place, and time. He appears well-developed and well-nourished. No distress.  Appears older than stated age  HENT:  Head: Normocephalic and atraumatic.  Eyes: Conjunctivae are normal. No scleral icterus.  Neck: Normal range of motion. Neck supple.  Cardiovascular: Normal rate, regular rhythm and normal heart sounds.   Pulmonary/Chest: Effort normal. No respiratory distress. He  has wheezes.  Diffuse expiratory wheeze   Abdominal: Soft. There is no tenderness.  Musculoskeletal: He exhibits no edema.  Neurological: He is alert and oriented to person, place, and time.  Skin: Skin is warm and dry. He is not diaphoretic.  Psychiatric: His behavior is normal.  Nursing note and vitals reviewed.    ED Treatments / Results  Labs (all labs ordered are listed, but only abnormal results are displayed) Labs Reviewed  COMPREHENSIVE METABOLIC PANEL - Abnormal; Notable for the following:       Result Value   Chloride 100 (*)    All other components within normal limits  URINALYSIS, ROUTINE W  REFLEX MICROSCOPIC - Abnormal; Notable for the following:    Color, Urine COLORLESS (*)    Specific Gravity, Urine 1.002 (*)    All other components within normal limits  CBC WITH DIFFERENTIAL/PLATELET  LIPASE, BLOOD  I-STAT CG4 LACTIC ACID, ED  I-STAT TROPOININ, ED    EKG  EKG Interpretation None      ECG interpretation   Date: 02/26/2017  Rate:    Rhythm: normal sinus rhythm  QRS Axis: normal  Intervals: normal  ST/T Wave abnormalities: normal  Conduction Disutrbances: none  Narrative Interpretation:   Old EKG Reviewed: No significant changes noted    Radiology Dg Chest 2 View  Result Date: 02/25/2017 CLINICAL DATA:  Productive cough for 2 days. Abdominal pain beginning today. Coughing up blood EXAM: CHEST  2 VIEW COMPARISON:  06/22/2015 FINDINGS: Prominent emphysematous changes in the lungs. Scattered fibrosis. No focal airspace disease or consolidation. Previous left lung consolidation has resolved. No blunting of costophrenic angles. No pneumothorax. Normal heart size and pulmonary vascularity. Old right rib fractures. IMPRESSION: Emphysematous changes and fibrosis in the lungs. No evidence of active pulmonary disease. Electronically Signed   By: Burman Nieves M.D.   On: 02/25/2017 21:43    Procedures Procedures (including critical care time)  Medications Ordered in ED Medications  gi cocktail (Maalox,Lidocaine,Donnatal) (not administered)  ipratropium-albuterol (DUONEB) 0.5-2.5 (3) MG/3ML nebulizer solution 3 mL (not administered)  sucralfate (CARAFATE) tablet 2 g (not administered)  famotidine (PEPCID) tablet 40 mg (not administered)     Initial Impression / Assessment and Plan / ED Course  I have reviewed the triage vital signs and the nursing notes.  Pertinent labs & imaging results that were available during my care of the patient were reviewed by me and considered in my medical decision making (see chart for details).    Labs and imaging reviewed  and are reassuring. EKG is without concerning findings for ischemia. Patient with gastritis. He had significant improvement with Carafate. And GI cocktail. Patient's breathing improved with neb treatments. I feel this is a COPD exacerbation given the emphysematous changes on his chest x-ray and heavy smoking history. Patient will be discharged with azithromycin, prednisone and albuterol for treatment along with treatment for gastritis. Advised to quit smoking, drinking and using NSAIDs and Goody powder. Patient given follow-up.  The patient was counseled on the dangers of tobacco use, and was advised to quit.  Reviewed strategies to maximize success, including removing cigarettes and smoking materials from environment, stress management, substitution of other forms of reinforcement, support of family/friends and written materials.   Final Clinical Impressions(s) / ED Diagnoses   Final diagnoses:  Gastritis, presence of bleeding unspecified, unspecified chronicity, unspecified gastritis type  COPD exacerbation (HCC)  Tobacco abuse    New Prescriptions New Prescriptions   No medications on file  Arthor Captainbigail Lesleyann Fichter, PA-C 02/26/17 2219    Raeford RazorStephen Kohut, MD 03/06/17 [phone]

## 2017-02-26 MED ORDER — PREDNISONE 20 MG PO TABS: 40.0000 mg | ORAL_TABLET | Freq: Every day | ORAL | 0 refills | Status: AC

## 2017-02-26 MED ORDER — OMEPRAZOLE-SODIUM BICARBONATE 40-1100 MG PO CAPS: 1.0000 | ORAL_CAPSULE | Freq: Every day | ORAL | 1 refills | Status: AC

## 2017-02-26 MED ORDER — AEROCHAMBER PLUS W/MASK MISC
1.0000 | Freq: Once | Status: AC
  Administered 2017-02-26: 1
  Filled 2017-02-26: qty 1

## 2017-02-26 MED ORDER — AZITHROMYCIN 250 MG PO TABS: ORAL_TABLET | ORAL | 0 refills | Status: AC

## 2017-02-26 MED ORDER — SUCRALFATE 1 G PO TABS: 1.0000 g | ORAL_TABLET | Freq: Three times a day (TID) | ORAL | 1 refills | Status: AC

## 2017-02-26 MED ORDER — ALBUTEROL SULFATE HFA 108 (90 BASE) MCG/ACT IN AERS
2.0000 | INHALATION_SPRAY | Freq: Once | RESPIRATORY_TRACT | Status: AC
  Administered 2017-02-26: 2 via RESPIRATORY_TRACT
  Filled 2017-02-26: qty 6.7

## 2017-02-26 NOTE — Discharge Instructions (Signed)
STOP Drinking alcohol, smoking cigarettes and using goody's powders. Do not take ibuprofen , advil, naproxen, or aspirin. Take the medications I have prescribed.    Get help right away if: You continue to  vomit blood or material that looks like coffee grounds. You have black or dark red stools. You are unable to keep fluids down. Your abdominal pain gets worse. You have a fever. You do not feel better after 1 week.

## 2017-09-06 IMAGING — DX DG CHEST 2V
2 series · 2 of 2 positions shown · non-contrast
Comparison: 06/22/2015

CLINICAL DATA: Productive cough for 2 days. Abdominal pain
beginning today. Coughing up blood

EXAM:
CHEST  2 VIEW

[chest pa]
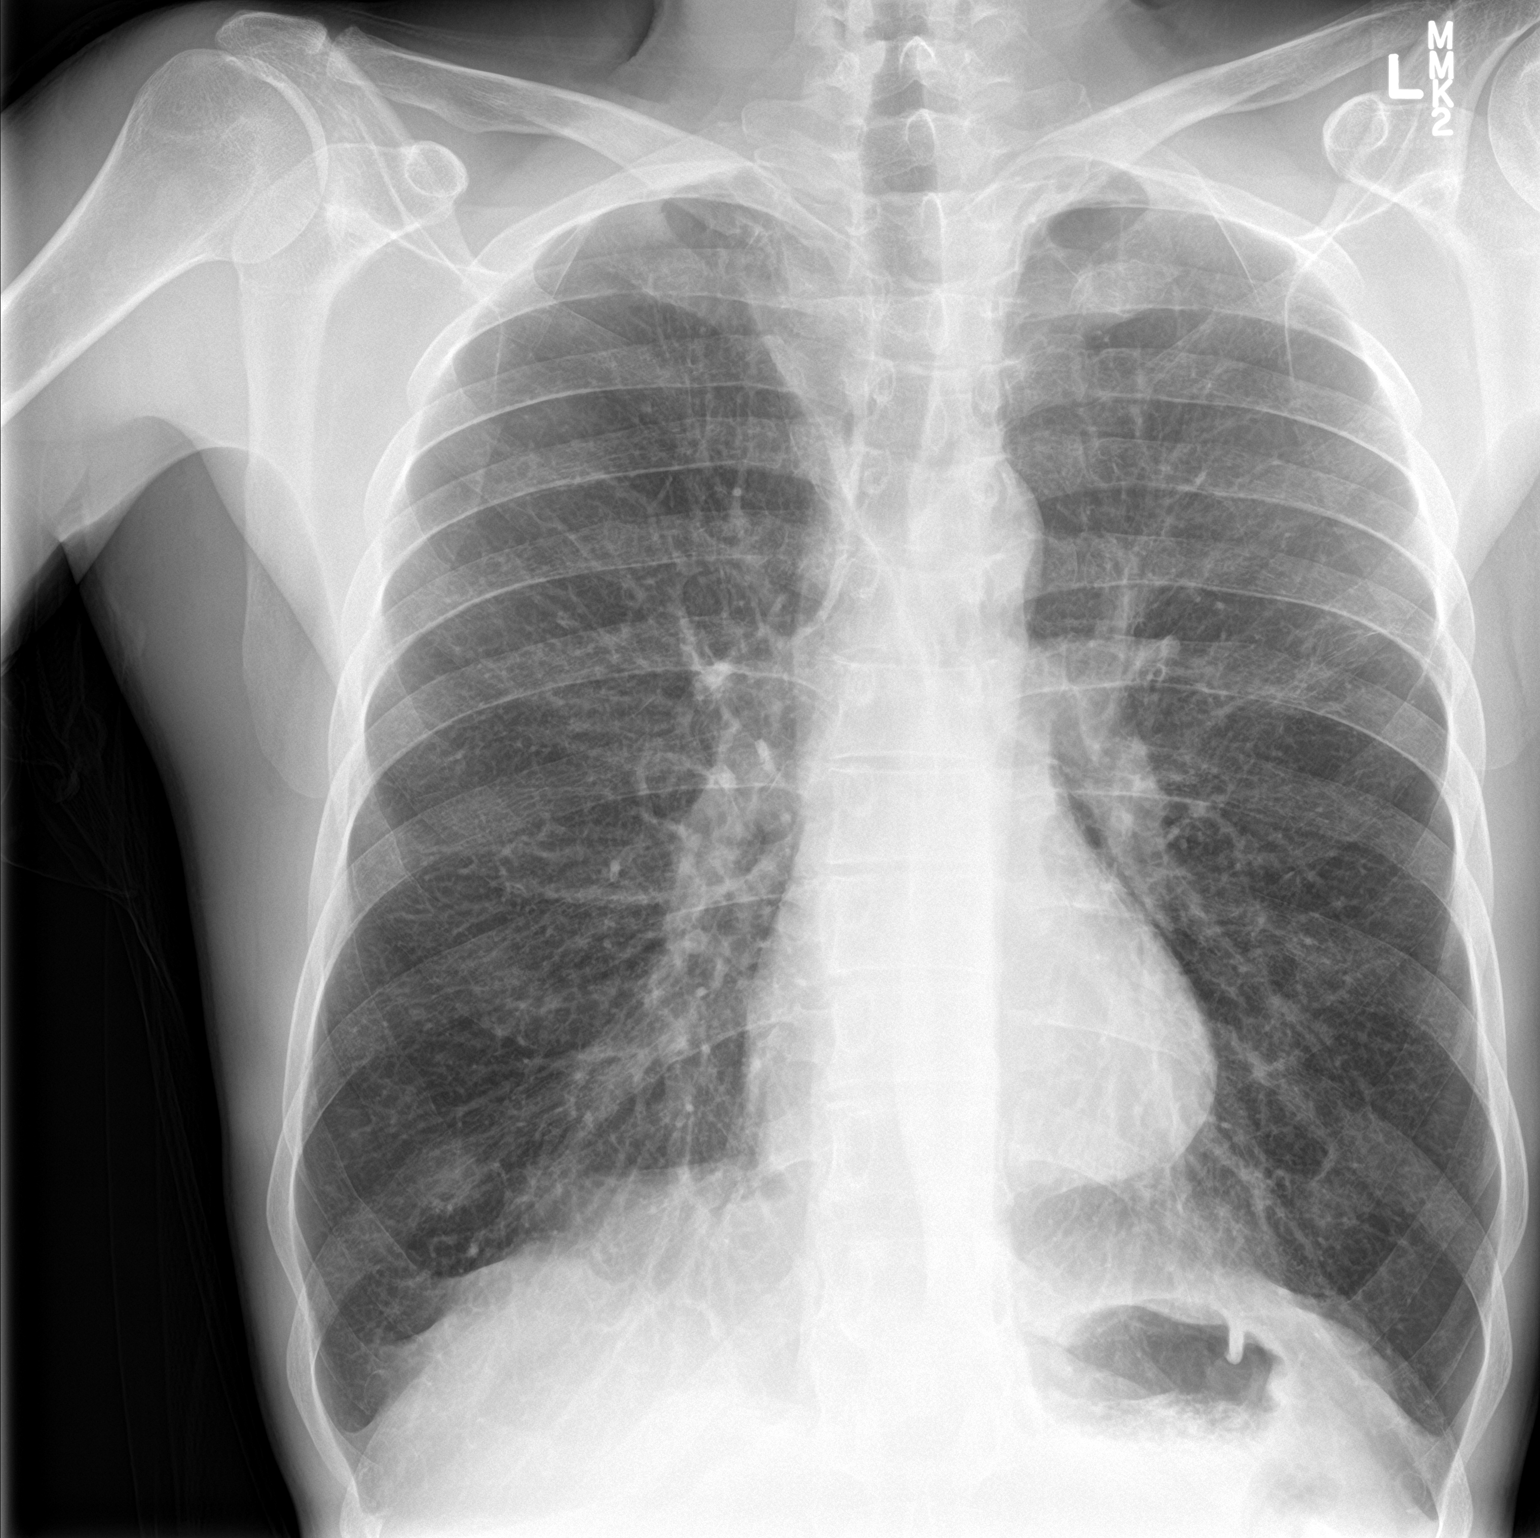

[chest lat]
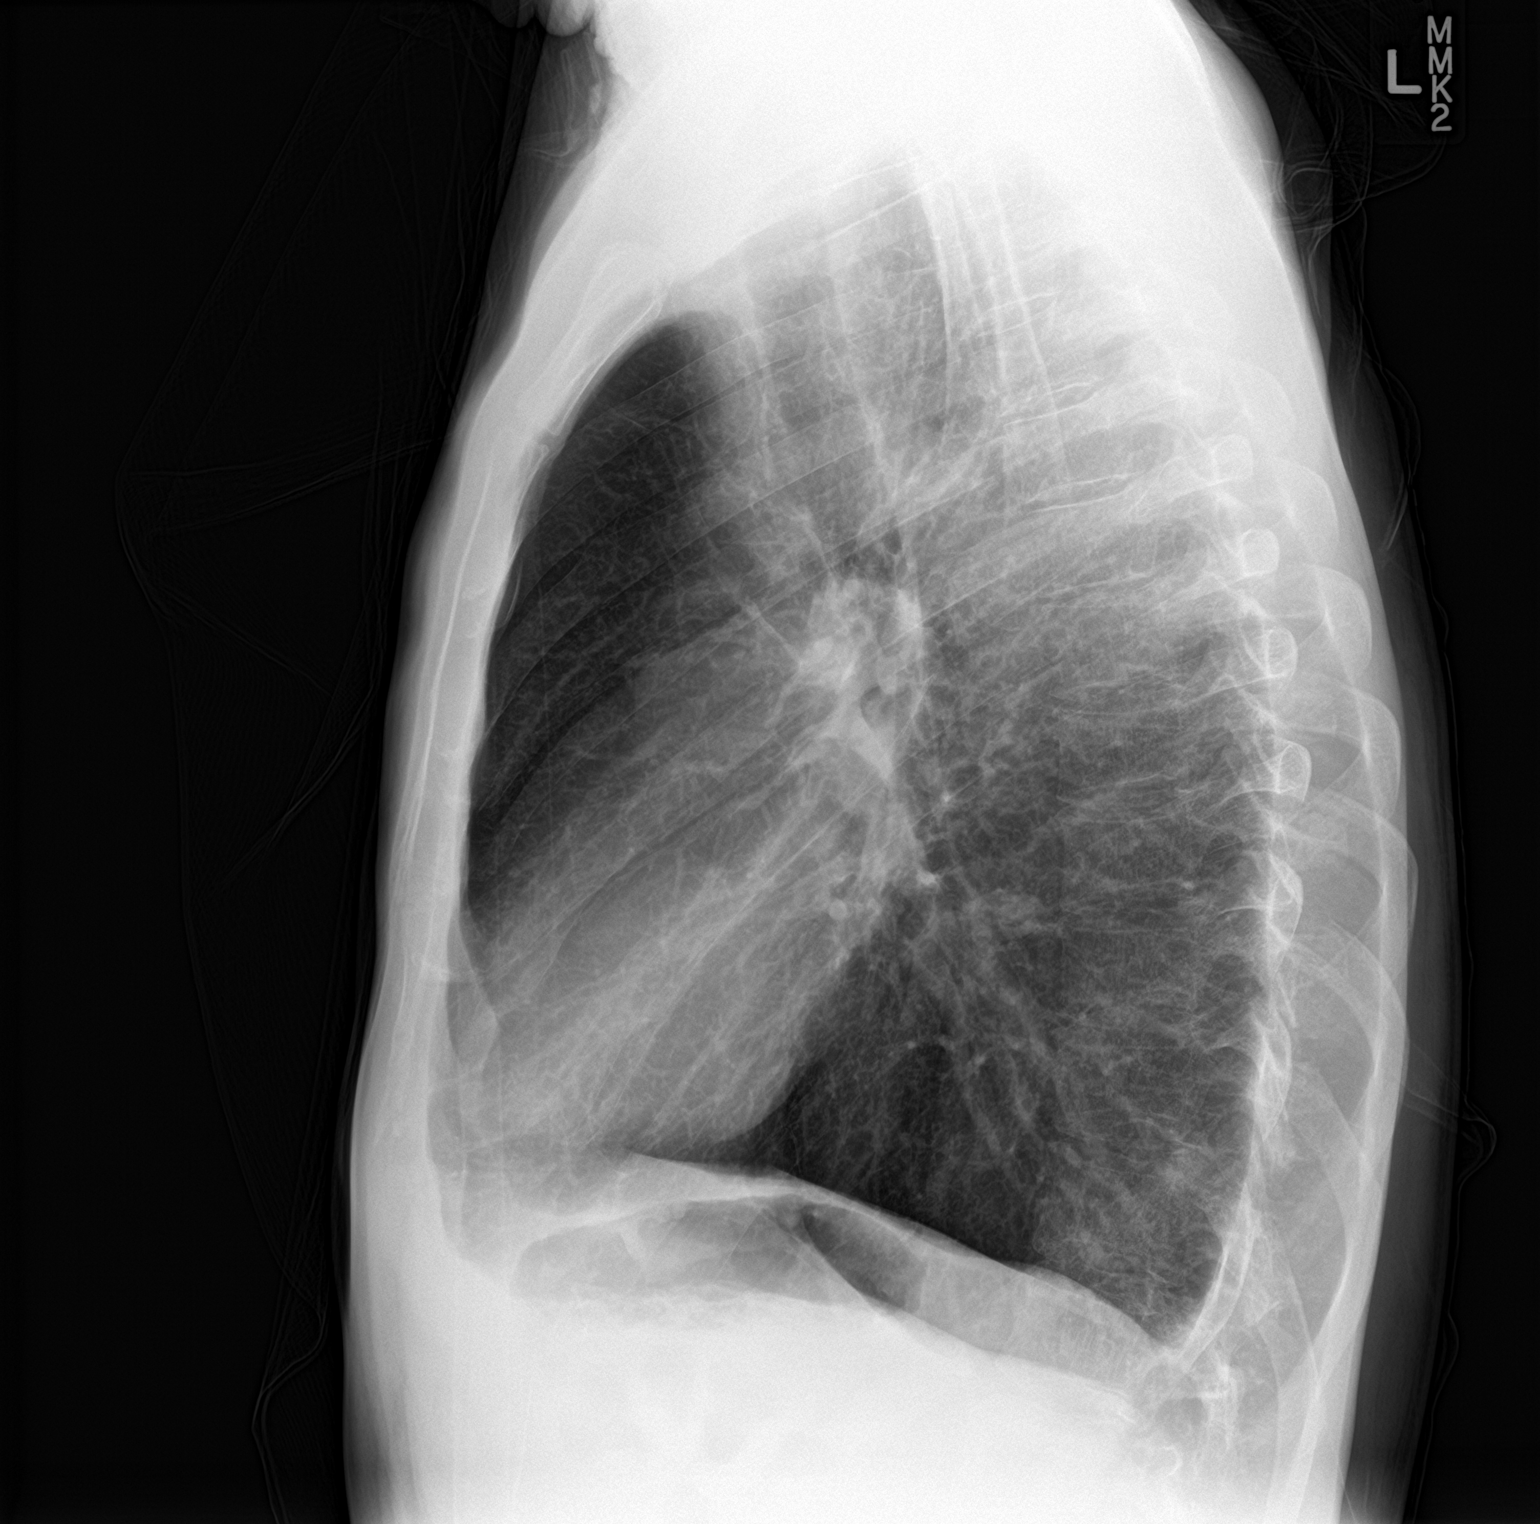

[2 of 2 positions shown; findings below may reference images not displayed]

FINDINGS: Prominent emphysematous changes in the lungs. Scattered fibrosis. No
focal airspace disease or consolidation. Previous left lung
consolidation has resolved. No blunting of costophrenic angles. No
pneumothorax. Normal heart size and pulmonary vascularity. Old right
rib fractures.
IMPRESSION: Emphysematous changes and fibrosis in the lungs. No evidence of
active pulmonary disease.

## 2025-01-22 ENCOUNTER — Emergency Department
Admission: EM | Admit: 2025-01-22 | Discharge: 2025-01-22 | Disposition: A | Discharge status: Home / Self Care | Payer: Self-pay | Source: Home / Self Care

## 2025-01-22 ENCOUNTER — Emergency Department: Payer: Self-pay

## 2025-01-22 ENCOUNTER — Other Ambulatory Visit: Payer: Self-pay

## 2025-01-22 ENCOUNTER — Telehealth: Payer: Self-pay | Admitting: Physician Assistant

## 2025-01-22 DIAGNOSIS — S6991XA Unspecified injury of right wrist, hand and finger(s), initial encounter: Secondary | ICD-10-CM

## 2025-01-22 DIAGNOSIS — S60221A Contusion of right hand, initial encounter: Secondary | ICD-10-CM

## 2025-01-22 MED ORDER — CYCLOBENZAPRINE HCL 5 MG PO TABS: 5.0000 mg | ORAL_TABLET | Freq: Three times a day (TID) | ORAL | 0 refills | Status: AC | PRN

## 2025-01-22 NOTE — ED Notes (Signed)
Patient discharged to home per MD order. Patient in stable condition, and deemed medically cleared by ED provider for discharge. Discharge instructions reviewed with patient/family using "Teach Back"; verbalized understanding of medication education and administration, and information about follow-up care. Denies further concerns. ° °

## 2025-01-22 NOTE — ED Triage Notes (Signed)
 Pt to ED for right hand injury 4 days ago while banging shovel on ice. Swelling noted.

## 2025-01-22 NOTE — ED Notes (Signed)
 Ace wrap to left hand placed by APP.

## 2025-01-22 NOTE — Telephone Encounter (Cosign Needed)
 I evaluated patient earlier for right hand sprain and soft tissue swelling, I intended to submit a prescription for a muscle relaxant at that time.  That prescription has been to the patient's pharmacy following his discharge.

## 2025-01-22 NOTE — ED Provider Notes (Signed)
 "   [facility] Emergency Department Provider Note     Event Date/Time   First MD Initiated Contact with Patient 01/22/25 1421     (approximate)   History   Hand Injury   HPI  Marcus Zamora is a 49 y.o. male with a history of tobacco use and alcohol use disorder, who presents to the ED endorsing right hand pain.  Patient reports pain onset 4 days ago when he was banging the ground repeatedly with a shovel on the ice during the recent winter weather.  He presents to the ED endorsing some pain and swelling to the dorsal right hand.  No other injury reported at this time.  Physical Exam   Triage Vital Signs: ED Triage Vitals  Encounter Vitals Group     BP 01/22/25 1251 (!) 139/96     Girls Systolic BP Percentile --      Girls Diastolic BP Percentile --      Boys Systolic BP Percentile --      Boys Diastolic BP Percentile --      Pulse Rate 01/22/25 1251 95     Resp 01/22/25 1251 16     Temp 01/22/25 1251 97.7 F (36.5 C)     Temp src --      SpO2 01/22/25 1251 100 %     Weight 01/22/25 1250 160 lb (72.6 kg)     Height 01/22/25 1250 6' (1.829 m)     Head Circumference --      Peak Flow --      Pain Score 01/22/25 1250 8     Pain Loc --      Pain Education --      Exclude from Growth Chart --     Most recent vital signs: Vitals:   01/22/25 1251  BP: (!) 139/96  Pulse: 95  Resp: 16  Temp: 97.7 F (36.5 C)  SpO2: 100%    General Awake, no distress. NAD HEENT NCAT. PERRL. EOMI. No rhinorrhea. Mucous membranes are moist.  CV:  Good peripheral perfusion.  RESP:  Normal effort.  MSK:  Right hand without obvious deformity or dislocation.  Some subtle dorsal soft tissue swelling is noted.  Some stable bony deformity of the proximal third metacarpal related to remote trauma.  Normal composite fist is noted.  No bruising, ecchymosis, or erythema is appreciated.   ED Results / Procedures / Treatments   Labs (all labs ordered are listed, but only  abnormal results are displayed) Labs Reviewed - No data to display   EKG   RADIOLOGY  I personally viewed and evaluated these images as part of my medical decision making, as well as reviewing the written report by the radiologist.  ED Provider Interpretation: No acute fracture  DG Hand Complete Right Result Date: 01/22/2025 EXAM: 3 OR MORE VIEW(S) XRAY OF THE RIGHT HAND 01/22/2025 01:10:00 PM COMPARISON: None available. CLINICAL HISTORY: Right hand/wrist pain after shoveling snow. FINDINGS: BONES AND JOINTS: No acute fracture. No malalignment. SOFT TISSUES: Dorsal hand soft tissue swelling. IMPRESSION: 1. No acute fracture or dislocation of the right hand. 2. Dorsal hand soft tissue swelling. Electronically signed by: Ryan Salvage MD 01/22/2025 01:31 PM EST RP Workstation: [id number]     PROCEDURES:  Critical Care performed: No  Procedures   MEDICATIONS ORDERED IN ED: Medications - No data to display   IMPRESSION / MDM / ASSESSMENT AND PLAN / ED COURSE  I reviewed the triage vital signs and the nursing  notes.                              Differential diagnosis includes, but is not limited to, hand contusion, hand sprain, fracture, dislocation, tendinitis  Patient's presentation is most consistent with acute complicated illness / injury requiring diagnostic workup.  Patient's diagnosis is consistent with likely right hip contusion and dorsal tendinitis secondary to repetitive motion.  Patient presents with some subtle dorsal soft tissue swelling as well as some pain with extension of the fingers after he spent several hours using a shovel like a pick ax to break up snow related to recent bad weather.  Patient's exam is largely reassuring without evidence of internal derangement, deformity, or gross disability.  X-ray images interpreted by me, show no acute fracture or dislocation.  Patient's hand is wrapped in an Ace bandage for comfort and support.  Patient will be  discharged home with prescriptions for benztropine to take with OTC Tylenol  or Motrin. Patient is to follow up with his primary provider or orthopedics as suggested, as needed or otherwise directed. Patient is given ED precautions to return to the ED for any worsening or new symptoms.   FINAL CLINICAL IMPRESSION(S) / ED DIAGNOSES   Final diagnoses:  Contusion of right hand, initial encounter  Injury of right hand, initial encounter     Rx / DC Orders   ED Discharge Orders     None        Note:  This document was prepared using Dragon voice recognition software and may include unintentional dictation errors.    Loyd Candida LULLA Aldona, PA-C 01/22/25 2054  "

## 2025-01-22 NOTE — Discharge Instructions (Signed)
 Your exam and x-ray are normal and reassuring at this time.  No signs of a fracture or dislocation to the hand.  Symptoms are likely represent some soft tissue swelling and pain due to your injury.  Take the prescription anti-inflammatory as directed.  Follow-up with [facility] orthopedics as needed.
